# Patient Record
Sex: Female | Born: 1960 | Race: White | Hispanic: No | Marital: Single | Smoking: Never smoker
Health system: Southern US, Community
[De-identification: ages and names within clinical notes are randomized; demographics above are authoritative.]

## PROBLEM LIST (undated history)

## (undated) DIAGNOSIS — U071 COVID-19: Secondary | ICD-10-CM

## (undated) DIAGNOSIS — I10 Essential (primary) hypertension: Secondary | ICD-10-CM

## (undated) DIAGNOSIS — F329 Major depressive disorder, single episode, unspecified: Secondary | ICD-10-CM

## (undated) DIAGNOSIS — R011 Cardiac murmur, unspecified: Secondary | ICD-10-CM

## (undated) DIAGNOSIS — K759 Inflammatory liver disease, unspecified: Secondary | ICD-10-CM

## (undated) DIAGNOSIS — I499 Cardiac arrhythmia, unspecified: Secondary | ICD-10-CM

## (undated) DIAGNOSIS — A6009 Herpesviral infection of other urogenital tract: Secondary | ICD-10-CM

## (undated) DIAGNOSIS — T7840XA Allergy, unspecified, initial encounter: Secondary | ICD-10-CM

## (undated) DIAGNOSIS — R519 Headache, unspecified: Secondary | ICD-10-CM

## (undated) DIAGNOSIS — R51 Headache: Secondary | ICD-10-CM

## (undated) DIAGNOSIS — F32A Depression, unspecified: Secondary | ICD-10-CM

## (undated) HISTORY — DX: Major depressive disorder, single episode, unspecified: F32.9

## (undated) HISTORY — DX: Herpesviral infection of other urogenital tract: A60.09

## (undated) HISTORY — DX: COVID-19: U07.1

## (undated) HISTORY — DX: Headache, unspecified: R51.9

## (undated) HISTORY — DX: Depression, unspecified: F32.A

## (undated) HISTORY — DX: Cardiac murmur, unspecified: R01.1

## (undated) HISTORY — DX: Allergy, unspecified, initial encounter: T78.40XA

## (undated) HISTORY — PX: WRIST FRACTURE SURGERY: SHX121

## (undated) HISTORY — DX: Headache: R51

---

## 1991-06-20 HISTORY — PX: SEPTOPLASTY: SHX2393

## 2003-06-20 HISTORY — PX: BREAST BIOPSY: SHX20

## 2007-03-26 ENCOUNTER — Ambulatory Visit: Payer: Self-pay | Admitting: *Deleted

## 2007-04-03 ENCOUNTER — Ambulatory Visit: Payer: Self-pay | Admitting: Gastroenterology

## 2007-04-16 ENCOUNTER — Ambulatory Visit: Payer: Self-pay | Admitting: Nurse Practitioner

## 2007-08-01 ENCOUNTER — Ambulatory Visit: Payer: Self-pay | Admitting: Family Medicine

## 2008-02-19 ENCOUNTER — Ambulatory Visit: Payer: Self-pay | Admitting: Internal Medicine

## 2009-04-30 ENCOUNTER — Ambulatory Visit: Payer: Self-pay | Admitting: Internal Medicine

## 2009-07-07 LAB — HM COLONOSCOPY

## 2009-07-16 ENCOUNTER — Ambulatory Visit: Payer: Self-pay | Admitting: Internal Medicine

## 2010-03-25 ENCOUNTER — Ambulatory Visit: Payer: Self-pay | Admitting: Internal Medicine

## 2012-02-21 ENCOUNTER — Ambulatory Visit: Payer: Self-pay | Admitting: Family Medicine

## 2013-06-30 ENCOUNTER — Encounter (INDEPENDENT_AMBULATORY_CARE_PROVIDER_SITE_OTHER): Payer: Self-pay

## 2013-06-30 ENCOUNTER — Encounter: Payer: Self-pay | Admitting: Adult Health

## 2013-06-30 ENCOUNTER — Ambulatory Visit (INDEPENDENT_AMBULATORY_CARE_PROVIDER_SITE_OTHER): Payer: 59 | Admitting: Adult Health

## 2013-06-30 VITALS — BP 112/66 | HR 78 | Resp 12 | Ht 62.75 in | Wt 178.5 lb

## 2013-06-30 DIAGNOSIS — Z7689 Persons encountering health services in other specified circumstances: Secondary | ICD-10-CM

## 2013-06-30 DIAGNOSIS — Z1239 Encounter for other screening for malignant neoplasm of breast: Secondary | ICD-10-CM | POA: Insufficient documentation

## 2013-06-30 DIAGNOSIS — Z7189 Other specified counseling: Secondary | ICD-10-CM

## 2013-06-30 MED ORDER — ACYCLOVIR 5 % EX CREA: 1.0000 "application " | TOPICAL_CREAM | CUTANEOUS | Status: DC

## 2013-06-30 MED ORDER — VALACYCLOVIR HCL 1 G PO TABS
1000.0000 mg | ORAL_TABLET | Freq: Two times a day (BID) | ORAL | Status: DC
Start: 2013-06-30 — End: 2016-04-03

## 2013-06-30 MED ORDER — ACYCLOVIR 5 % EX CREA: 1.0000 "application " | TOPICAL_CREAM | Freq: Four times a day (QID) | CUTANEOUS | Status: DC

## 2013-06-30 NOTE — Assessment & Plan Note (Signed)
Reviewed H&P. Will request records from previous PCP. Send for Mammogram. Pt needs physical exam including breast, pelvic/PAP. She will schedule this.

## 2013-06-30 NOTE — Patient Instructions (Signed)
  Please remember to schedule your appt for a physical exam.  I have given you a prescription for your Mammogram. Please have them send me the results.  I will request your medical records from your previous provider.  I have sent in prescriptions for your medications as we discussed.  Thank you for choosing [place] [facility] for your health care needs.

## 2013-06-30 NOTE — Assessment & Plan Note (Signed)
Send for Mammogram 

## 2013-06-30 NOTE — Progress Notes (Signed)
Subjective:    Patient ID: Michele Hammond, female    DOB: 1960-07-24, 53 y.o.   MRN: [id number]  HPI  Patient is a pleasant 53 year old female who presents to clinic to establish care. She was previously followed by Dr. Marlan PalauAllison Hammond in [place], [place]. Will request records.   Mammogram - 2008 (Normal) - Will order PAP - Last 2011 - Normal - Needs to have this done     Past Medical History  Diagnosis Date  . Depression     Followed By Dr. Sallyanne Hammond [facility]  . Frequent headaches   . Allergy   . Heart murmur      Past Surgical History  Procedure Laterality Date  . Cesarean section  1990, 1996  . Septoplasty  1993    Deviated Septum     Family History  Problem Relation Age of Onset  . Cancer Mother     uterine cancer  . Depression Mother   . Hyperlipidemia Father   . Hypertension Father   . Diabetes Father   . Hypertension Sister   . Depression Sister   . Hypertension Sister   . Hypertension Brother      History   Social History  . Marital Status: Single    Spouse Name: N/A    Number of Children: 3  . Years of Education: 14   Occupational History  . Medical Lab Technician     [employer]   Social History Main Topics  . Smoking status: Never Smoker   . Smokeless tobacco: Never Used  . Alcohol Use: Yes     Comment: 1 glass of wine weekly  . Drug Use: No  . Sexual Activity: Not on file   Other Topics Concern  . Not on file   Social History Narrative   Michele Hammond grew up in [place], [place]. She is currently living in [place], [place]. She is living alone. 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 Michele Hammond enjoys riding her horses on her spare time. She has two horses and a pony.     Review of Systems  Constitutional: Positive for fatigue.  HENT: Positive for sinus pressure.   Eyes: Negative.   Respiratory: Negative.   Cardiovascular: Negative.   Gastrointestinal: Negative.   Endocrine: Negative.   Genitourinary: Negative.   Musculoskeletal: Negative.     Skin: Negative.   Allergic/Immunologic: Negative.   Neurological: Negative.   Hematological: Negative.   Psychiatric/Behavioral: Negative.        Objective:   Physical Exam  Constitutional: She is oriented to person, place, and time. She appears well-developed and well-nourished. No distress.  HENT:  Head: Normocephalic and atraumatic.  Nose: Nose normal.  Mouth/Throat: Oropharynx is clear and moist.  Eyes: Conjunctivae are normal. Pupils are equal, round, and reactive to light.  Neck: Normal range of motion. Neck supple. No tracheal deviation present. No thyromegaly present.  Cardiovascular: Normal rate, regular rhythm, normal heart sounds and intact distal pulses.  Exam reveals no gallop and no friction rub.   No murmur heard. Pulmonary/Chest: Effort normal and breath sounds normal. No respiratory distress. She has no wheezes. She has no rales.  Abdominal: Bowel sounds are normal.  Musculoskeletal: Normal range of motion. She exhibits no edema.  Lymphadenopathy:    She has no cervical adenopathy.  Neurological: She is alert and oriented to person, place, and time. She has normal reflexes. No cranial nerve deficit. Coordination normal.  Skin: Skin is warm and dry.  Psychiatric: She  has a normal mood and affect. Her behavior is normal. Judgment and thought content normal.          Assessment & Plan:

## 2013-07-14 ENCOUNTER — Encounter: Payer: Self-pay | Admitting: Adult Health

## 2013-07-17 ENCOUNTER — Ambulatory Visit: Payer: Self-pay | Admitting: Adult Health

## 2013-07-25 ENCOUNTER — Encounter: Payer: Self-pay | Admitting: Adult Health

## 2013-07-25 ENCOUNTER — Ambulatory Visit (INDEPENDENT_AMBULATORY_CARE_PROVIDER_SITE_OTHER): Payer: 59 | Admitting: Adult Health

## 2013-07-25 ENCOUNTER — Other Ambulatory Visit (HOSPITAL_COMMUNITY)
Admission: RE | Admit: 2013-07-25 | Discharge: 2013-07-25 | Disposition: A | Discharge status: Home / Self Care | Payer: 59 | Source: Ambulatory Visit | Attending: Adult Health | Admitting: Adult Health

## 2013-07-25 VITALS — BP 126/76 | HR 82 | Resp 12 | Ht 62.75 in | Wt 176.5 lb

## 2013-07-25 DIAGNOSIS — N86 Erosion and ectropion of cervix uteri: Secondary | ICD-10-CM

## 2013-07-25 DIAGNOSIS — Z01419 Encounter for gynecological examination (general) (routine) without abnormal findings: Secondary | ICD-10-CM | POA: Insufficient documentation

## 2013-07-25 DIAGNOSIS — K5900 Constipation, unspecified: Secondary | ICD-10-CM

## 2013-07-25 DIAGNOSIS — Z124 Encounter for screening for malignant neoplasm of cervix: Secondary | ICD-10-CM | POA: Insufficient documentation

## 2013-07-25 DIAGNOSIS — Z0000 Encounter for general adult medical examination without abnormal findings: Secondary | ICD-10-CM

## 2013-07-25 DIAGNOSIS — J3489 Other specified disorders of nose and nasal sinuses: Secondary | ICD-10-CM

## 2013-07-25 DIAGNOSIS — R233 Spontaneous ecchymoses: Secondary | ICD-10-CM

## 2013-07-25 DIAGNOSIS — Z1151 Encounter for screening for human papillomavirus (HPV): Secondary | ICD-10-CM | POA: Insufficient documentation

## 2013-07-25 MED ORDER — MOMETASONE FUROATE 50 MCG/ACT NA SUSP: NASAL | Status: DC

## 2013-07-25 MED ORDER — POLYETHYLENE GLYCOL 3350 17 G PO PACK: 17.0000 g | PACK | Freq: Every day | ORAL | Status: DC

## 2013-07-25 MED ORDER — CETIRIZINE HCL 10 MG PO TABS: 10.0000 mg | ORAL_TABLET | Freq: Every day | ORAL | Status: DC

## 2013-07-25 NOTE — Patient Instructions (Signed)
  I am referring you to GYN for a small ulceration on your cervix. I would like them to also evaluate the two moles and determine if these need to be biopsied.  I will contact you with the results of the PAP once it is available.  Please call with any questions or concerns.

## 2013-07-25 NOTE — Progress Notes (Signed)
Pre visit review using our clinic review tool, if applicable. No additional management support is needed unless otherwise documented below in the visit note. 

## 2013-07-25 NOTE — Progress Notes (Signed)
Patient ID: Michele Hammond, female   DOB: May 13, 1961, 53 y.o.   MRN: [id number]    Subjective:    Patient ID: Michele Hammond, female    DOB: 01-Jun-1961, 53 y.o.   MRN: [id number]  HPI  Patient presents to clinic for her yearly physical exam including breast, pelvic/pap. She also reports having sinus pressure that has been ongoing for some time. No fever or discoloration of secretions noted. Occasional HA. She also reports that she is experiencing some constipation. She believes it may be a side effect of one of her medications. No other concerns this visit.    Past Medical History  Diagnosis Date  . Depression     Followed By Dr. Sallyanne Kuster [facility]  . Frequent headaches   . Allergy   . Heart murmur   . Herpes genitalis in women    Past Surgical History  Procedure Laterality Date  . Cesarean section  1990, 1996  . Septoplasty  1993    Deviated Septum   Family History  Problem Relation Age of Onset  . Cancer Mother     uterine cancer  . Depression Mother   . Hyperlipidemia Father   . Hypertension Father   . Diabetes Father   . Hypertension Sister   . Depression Sister   . Hypertension Sister   . Hypertension Brother    History   Social History  . Marital Status: Single    Spouse Name: N/A    Number of Children: 3  . Years of Education: 14   Occupational History  . Medical Lab Technician     [employer]   Social History Main Topics  . Smoking status: Never Smoker   . Smokeless tobacco: Never Used  . Alcohol Use: Yes     Comment: 1 glass of wine weekly  . Drug Use: No  . Sexual Activity: Not on file   Other Topics Concern  . Not on file   Social History Narrative   Noya grew up in [place], [place]. She is currently living in [place], [place]. She is living alone. 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 Freddi enjoys riding her horses on her spare time. She has two horses and a pony.     Current Outpatient Prescriptions on File Prior to Visit  Medication Sig  Dispense Refill  . buPROPion (WELLBUTRIN XL) 150 MG 24 hr tablet Take 450 mg by mouth daily.      Marland Kitchen acyclovir cream (ZOVIRAX) 5 % Apply 1 application topically 4 (four) times daily.  15 g  0  . valACYclovir (VALTREX) 1000 MG tablet Take 1 tablet (1,000 mg total) by mouth 2 (two) times daily.  20 tablet  5   No current facility-administered medications on file prior to visit.     Review of Systems  Constitutional: Negative.   HENT: Positive for sinus pressure.   Eyes: Negative.   Respiratory: Negative.   Cardiovascular: Negative.   Gastrointestinal: Positive for constipation.  Endocrine: Negative.   Genitourinary: Negative.   Musculoskeletal: Negative.   Skin: Negative.   Allergic/Immunologic: Negative.   Neurological: Negative.   Hematological: Negative.   Psychiatric/Behavioral: Negative.        Objective:  BP 126/76  Pulse 82  Resp 12  Ht 5' 2.75" (1.594 m)  Wt 176 lb 8 oz (80.06 kg)  BMI 31.51 kg/m2  SpO2 97%   Physical Exam  Constitutional: She is oriented to person, place, and time. She appears well-developed and well-nourished.  No distress.  HENT:  Head: Normocephalic and atraumatic.  Right Ear: External ear normal.  Left Ear: External ear normal.  Nose: Nose normal.  Mouth/Throat: Oropharynx is clear and moist.  Eyes: Conjunctivae and EOM are normal. Pupils are equal, round, and reactive to light.  Neck: Normal range of motion. Neck supple. No tracheal deviation present. No thyromegaly present.  Cardiovascular: Normal rate, regular rhythm, normal heart sounds and intact distal pulses.  Exam reveals no gallop and no friction rub.   No murmur heard. Pulmonary/Chest: Effort normal and breath sounds normal. No respiratory distress. She has no wheezes. She has no rales.  Abdominal: Soft. Bowel sounds are normal. She exhibits no distension and no mass. There is no tenderness. There is no rebound and no guarding.  Genitourinary: Uterus normal. Rectal exam shows  external hemorrhoid. Rectal exam shows no internal hemorrhoid, no fissure, no mass, no tenderness and anal tone normal. Guaiac negative stool.    Right adnexum displays no mass, no tenderness and no fullness. Left adnexum displays no mass, no tenderness and no fullness.    Musculoskeletal: Normal range of motion. She exhibits no edema and no tenderness.  Lymphadenopathy:    She has no cervical adenopathy.  Neurological: She is alert and oriented to person, place, and time. She has normal reflexes. No cranial nerve deficit. Coordination normal.  Skin: Skin is warm and dry.  Psychiatric: She has a normal mood and affect. Her behavior is normal. Judgment and thought content normal.       Assessment & Plan:   1. Routine physical examination Normal physical exam except for problems listed separately below. PAP/Pelvic and breast exam completed. Routine preventative care addressed. Mammogram UTD. Medications reviewed and refills provided accordingly.  2. Pap smear for cervical cancer screening PAP with HPV done today. Cervical ulceration noted on exam  - Cytology - PAP   3. Ulcer, cervix Pt reports no hx of cervical issues. Refer to GYN for further evaluation.  4. Petechiae Noted on labia majora. Suspect this may be 2/2 her riding horses bareback. She also has a freckle and mole on labia. She reports that previous PCP wanted to biopsy but never done. Pt is being referred to GYN for further evaluation.   5. Sinus pressure Start Nasonex 2 sprays into each nostril daily.   6. Constipation Start Miralax daily. May adjust dose as needed depending on the consistency and frequency of stool. This was discussed with pt.

## 2013-07-26 ENCOUNTER — Encounter: Payer: Self-pay | Admitting: Adult Health

## 2013-07-26 DIAGNOSIS — K5900 Constipation, unspecified: Secondary | ICD-10-CM | POA: Insufficient documentation

## 2013-07-26 DIAGNOSIS — R233 Spontaneous ecchymoses: Secondary | ICD-10-CM | POA: Insufficient documentation

## 2013-07-26 DIAGNOSIS — J3489 Other specified disorders of nose and nasal sinuses: Secondary | ICD-10-CM | POA: Insufficient documentation

## 2013-07-31 ENCOUNTER — Encounter: Payer: Self-pay | Admitting: *Deleted

## 2013-08-12 ENCOUNTER — Encounter: Payer: Self-pay | Admitting: Adult Health

## 2013-11-07 ENCOUNTER — Encounter (INDEPENDENT_AMBULATORY_CARE_PROVIDER_SITE_OTHER): Payer: Self-pay

## 2013-11-07 ENCOUNTER — Encounter: Payer: Self-pay | Admitting: Adult Health

## 2013-11-07 ENCOUNTER — Ambulatory Visit (INDEPENDENT_AMBULATORY_CARE_PROVIDER_SITE_OTHER): Payer: 59 | Admitting: Adult Health

## 2013-11-07 VITALS — BP 122/78 | HR 88 | Temp 98.3°F | Resp 14 | Wt 173.5 lb

## 2013-11-07 DIAGNOSIS — R894 Abnormal immunological findings in specimens from other organs, systems and tissues: Secondary | ICD-10-CM

## 2013-11-07 DIAGNOSIS — B1920 Unspecified viral hepatitis C without hepatic coma: Secondary | ICD-10-CM | POA: Insufficient documentation

## 2013-11-07 DIAGNOSIS — R768 Other specified abnormal immunological findings in serum: Secondary | ICD-10-CM

## 2013-11-07 NOTE — Progress Notes (Signed)
Pre visit review using our clinic review tool, if applicable. No additional management support is needed unless otherwise documented below in the visit note. 

## 2013-11-07 NOTE — Progress Notes (Signed)
Patient ID: Michele Hammond, female   DOB: 06-05-1961, 53 y.o.   MRN: [id number]   Subjective:    Patient ID: Michele Hammond, female    DOB: 30-Aug-1960, 53 y.o.   MRN: [id number]  HPI  Patient is a pleasant 53 year old female who presents to clinic after testing positive for hepatitis C. Patient had antibody testing through employer. Her liver enzymes are normal. She has been experiencing fatigue. Patient reports that her first husband was an IV drug user. Believes she may have been exposed while married to him. She is very concerned and nervous about her test results. She would like to be referred as soon as possible to have this evaluated further.   Past Medical History  Diagnosis Date  . Depression     Followed By Dr. Sallyanne Kuster [facility]  . Frequent headaches   . Allergy   . Heart murmur   . Herpes genitalis in women     Current Outpatient Prescriptions on File Prior to Visit  Medication Sig Dispense Refill  . acyclovir cream (ZOVIRAX) 5 % Apply 1 application topically 4 (four) times daily.  15 g  0  . buPROPion (WELLBUTRIN XL) 150 MG 24 hr tablet Take 450 mg by mouth daily.      . cetirizine (ZYRTEC) 10 MG tablet Take 1 tablet (10 mg total) by mouth daily.  30 tablet  11  . mometasone (NASONEX) 50 MCG/ACT nasal spray 2 sprays into each nostril daily  17 g  11  . polyethylene glycol (MIRALAX / GLYCOLAX) packet Take 17 g by mouth daily.  14 each  11  . valACYclovir (VALTREX) 1000 MG tablet Take 1 tablet (1,000 mg total) by mouth 2 (two) times daily.  20 tablet  5  . venlafaxine (EFFEXOR) 75 MG tablet Take 150 mg by mouth daily.       No current facility-administered medications on file prior to visit.     Review of Systems  Constitutional: Positive for fatigue. Negative for fever (has felt feverish).  Respiratory: Negative.   Cardiovascular: Negative.   Gastrointestinal: Positive for nausea (occasional nausea). Negative for vomiting, abdominal pain and diarrhea.  Genitourinary:  Negative.   Musculoskeletal:       Malaise  Neurological: Positive for weakness. Negative for dizziness, light-headedness and headaches.  Psychiatric/Behavioral: The patient is nervous/anxious (about recent testing positive for hepatitis c).        Objective:  BP 122/78  Pulse 88  Temp(Src) 98.3 F (36.8 C) (Oral)  Resp 14  Wt 173 lb 8 oz (78.699 kg)  SpO2 98%   Physical Exam  Constitutional: She is oriented to person, place, and time. No distress.  HENT:  Head: Normocephalic and atraumatic.  Eyes: Conjunctivae and EOM are normal.  Neck: Normal range of motion. Neck supple.  Cardiovascular: Normal rate and regular rhythm.   Pulmonary/Chest: Effort normal. No respiratory distress.  Abdominal: Soft. Bowel sounds are normal. She exhibits no distension and no mass. There is no hepatosplenomegaly. There is no tenderness. There is no rebound and no guarding.  Musculoskeletal: Normal range of motion.  Neurological: She is alert and oriented to person, place, and time. She has normal reflexes. Coordination normal.  Skin: Skin is warm and dry.  Psychiatric: She has a normal mood and affect. Her behavior is normal. Judgment and thought content normal.      Assessment & Plan:   1. Hepatitis C antibody test positive Refer to Dr. Orvan Falconer in [place]. I am sending her to an  ultrasound. Recent blood work shows normal liver function. She is very nervous and anxious about getting evaluated. She has been feeling fatigue for some time and now concerned that she may have had this for many years. Concerned about complications. Spent time with her trying to allay some of her anxiety. Encouraged her to rest but stay as active as she tolerates. Avoid all alcohol and any medication that may be harsh on the liver such as tylenol.  - Ambulatory referral to Infectious Disease - US Abdomen Limited RUQ; Future

## 2013-11-07 NOTE — Patient Instructions (Addendum)
  I am referring you to have an ultrasound of the liver.  I am referring you to Dr. Orvan Falconer at [facility]. If you have not heard back from [place] on Wednesday please call.  Try to relax and not stress too much until we get more answers for you.  Avoid all alcohol. Avoid tylenol

## 2013-11-20 ENCOUNTER — Ambulatory Visit (INDEPENDENT_AMBULATORY_CARE_PROVIDER_SITE_OTHER): Payer: 59 | Admitting: Internal Medicine

## 2013-11-20 ENCOUNTER — Encounter: Payer: Self-pay | Admitting: Internal Medicine

## 2013-11-20 ENCOUNTER — Ambulatory Visit: Payer: Self-pay | Admitting: Adult Health

## 2013-11-20 VITALS — BP 133/89 | HR 82 | Temp 98.2°F | Ht 63.0 in | Wt 174.0 lb

## 2013-11-20 DIAGNOSIS — B1920 Unspecified viral hepatitis C without hepatic coma: Secondary | ICD-10-CM

## 2013-11-20 LAB — IRON: Iron: 48 ug/dL (ref 42–145)

## 2013-11-20 MED ORDER — DOXYCYCLINE HYCLATE 100 MG PO TABS: 100.0000 mg | ORAL_TABLET | Freq: Two times a day (BID) | ORAL | Status: DC

## 2013-11-20 NOTE — Progress Notes (Signed)
+  Michele Hammond is a 53 y.o. female who presents for initial evaluation and management of a positive Hepatitis C antibody test.  Patient tested positive after blood splash expsure through occupational health. Test was performed as part of an evaluation of exposure. Hepatitis C risk factors present are: sexual contact with person with liver disease (details: ex husband with hep c and died of liver cancer). Patient denies acupuncture, history of blood transfusion, history of clotting factor transfusion, intranasal drug use, IV drug abuse, multiple sexual partners, renal dialysis, tattoos. Patient has had other studies performed. Results: hepatitis C RNA by PCR, result: positive. Patient has not had prior treatment for Hepatitis C. Patient does not have a past history of liver disease. Patient does not have a family history of liver disease.   HPI:   Patient does not have documented immunity to Hepatitis A. Patient does have documented immunity to Hepatitis B.     Review of Systems A comprehensive review of systems was negative.   Past Medical History  Diagnosis Date  . Depression     Followed By Dr. Sallyanne Kuster [facility]  . Frequent headaches   . Allergy   . Heart murmur   . Herpes genitalis in women     History  Substance Use Topics  . Smoking status: Never Smoker   . Smokeless tobacco: Never Used  . Alcohol Use: Yes     Comment: 1 glass of wine weekly    Family History  Problem Relation Age of Onset  . Cancer Mother     uterine cancer  . Depression Mother   . Hyperlipidemia Father   . Hypertension Father   . Diabetes Father   . Hypertension Sister   . Depression Sister   . Hypertension Sister   . Hypertension Brother       Objective:   Filed Vitals:   11/20/13 1446  BP: 133/89  Pulse: 82  Temp: 98.2 F (36.8 C)   in no apparent distress and well developed and well nourished HEENT: anicteric Cor RRR and No murmurs clear Bowel sounds are normal, liver is not enlarged,  spleen is not enlarged peripheral pulses normal, no pedal edema, no clubbing or cyanosis negative for - jaundice, spider hemangioma, telangiectasia, palmar erythema, ecchymosis and atrophy  Laboratory Genotype: unknown No results found for this basename: hcvgenotype   HCV viral load:  No results found for this basename: HCVQUANT   No results found for this basename: WBC, HGB, HCT, MCV, PLT    No results found for this basename: CREATININE, BUN, NA, K, CL, CO2    No results found for this basename: ALT, AST, GGT, ALKPHOS, BILITOT, INR      Assessment: Hepatitis C genotype unknown  Plan: 1) Patient counseled extensively on limiting acetaminophen to no more than 2 grams daily, avoidance of alcohol. 2) Transmission discussed with patient including sexual transmission, sharing razors and toothbrush.   3) Will need referral to gastroenterology: depends on elastography 4) Will need referral for substance abuse counseling: No 5) Follow up 2 weeks after elastography

## 2013-11-21 LAB — PROTIME-INR
INR: 0.91 (ref <1.50)
Prothrombin Time: 12.2 seconds (ref 11.6–15.2)

## 2013-11-21 LAB — HCV RNA QUANT RFLX ULTRA OR GENOTYP
HCV Quantitative Log: 5.59 {Log} — ABNORMAL HIGH (ref <1.18)
HCV Quantitative: 389640 IU/mL — ABNORMAL HIGH (ref <15)

## 2013-11-21 LAB — ANA: Anti Nuclear Antibody(ANA): NEGATIVE

## 2013-11-24 ENCOUNTER — Other Ambulatory Visit: Payer: 59

## 2013-11-24 ENCOUNTER — Telehealth: Payer: Self-pay | Admitting: Adult Health

## 2013-11-24 ENCOUNTER — Telehealth: Payer: Self-pay | Admitting: *Deleted

## 2013-11-24 DIAGNOSIS — B1920 Unspecified viral hepatitis C without hepatic coma: Secondary | ICD-10-CM

## 2013-11-24 NOTE — Telephone Encounter (Signed)
LM on patient's voice mail.  Patient is scheduled for Elastography 6/15, 9:45 at [facility].  Scheduling's phone number is [phone] if she needs to alter the appt.  Pt must be NPO for 8 hours prior to the procedure.   Andree Coss, RN

## 2013-11-24 NOTE — Telephone Encounter (Signed)
Liver ultrasound results

## 2013-11-25 LAB — HEPATITIS C GENOTYPE: HCV GENOTYPE: 3

## 2013-11-26 ENCOUNTER — Telehealth: Payer: Self-pay | Admitting: Adult Health

## 2013-11-26 ENCOUNTER — Telehealth: Payer: Self-pay | Admitting: *Deleted

## 2013-11-26 NOTE — Telephone Encounter (Signed)
Confirmed upcoming appointments 6/15 - elastography and 6/18 - follow up.  Pt understands preparation instructions for elastography. Andree Coss, RN

## 2013-11-26 NOTE — Telephone Encounter (Signed)
No answer, voicemail left on patient's cell phone. Left callback for patient. Asked to call me back and let me know if she a GI preference.

## 2013-11-26 NOTE — Telephone Encounter (Signed)
Pt has not read the message I sent. Please call pt. 

## 2013-12-01 ENCOUNTER — Ambulatory Visit: Payer: Self-pay

## 2013-12-04 ENCOUNTER — Ambulatory Visit (INDEPENDENT_AMBULATORY_CARE_PROVIDER_SITE_OTHER): Payer: 59 | Admitting: Internal Medicine

## 2013-12-04 ENCOUNTER — Encounter: Payer: Self-pay | Admitting: Internal Medicine

## 2013-12-04 VITALS — BP 127/83 | HR 93 | Temp 98.0°F | Ht 63.0 in | Wt 174.0 lb

## 2013-12-04 DIAGNOSIS — B182 Chronic viral hepatitis C: Secondary | ICD-10-CM

## 2013-12-05 NOTE — Progress Notes (Addendum)
Here for follow up of HCV.  Had elastography which shows F0.  Has genotype 3.  Does not qualify for treatment at this time with no significant fibrosis, will discuss with pharmacy to see if possible, but not likely.  Also with genotype 3 better treatment options will be available in the future that don't require ribavirin.     Discussed results of elastography with her.  Hope to have better options in the future that are accesible, even if denied by insurance.   She will follow up in 1 year unless we are able to get treatment sooner, will keep her on Hep C list for follow up.    15 minutes spent with 10 minutes of discussion of results.

## 2013-12-09 ENCOUNTER — Encounter: Payer: Self-pay | Admitting: Adult Health

## 2013-12-12 ENCOUNTER — Encounter: Payer: Self-pay | Admitting: Adult Health

## 2014-01-12 ENCOUNTER — Encounter: Payer: Self-pay | Admitting: Internal Medicine

## 2014-03-02 ENCOUNTER — Other Ambulatory Visit: Payer: Self-pay | Admitting: Internal Medicine

## 2014-03-02 DIAGNOSIS — B182 Chronic viral hepatitis C: Secondary | ICD-10-CM

## 2014-03-02 MED ORDER — SOFOSBUVIR 400 MG PO TABS: 1.0000 | ORAL_TABLET | Freq: Every day | ORAL | Status: DC

## 2014-03-02 MED ORDER — DACLATASVIR DIHYDROCHLORIDE 60 MG PO TABS
60.0000 mg | ORAL_TABLET | Freq: Every day | ORAL | Status: DC
Start: 2014-03-02 — End: 2014-09-02

## 2014-03-13 ENCOUNTER — Telehealth: Payer: Self-pay | Admitting: Internal Medicine

## 2014-03-13 DIAGNOSIS — Z0279 Encounter for issue of other medical certificate: Secondary | ICD-10-CM

## 2014-03-13 NOTE — Telephone Encounter (Signed)
patient's FM [place] form has been completed for the diagnosis of hep C,  She does not need time off from work  For illness currently,  Only for MD visits with ID specialist.  She will be reassigned to the new NP now that Raquel has left.

## 2014-03-16 NOTE — Telephone Encounter (Signed)
FMLA faxed. 1 copy sent to scan, 1 given to manager for charge. Pt notified via mychart.

## 2014-05-29 ENCOUNTER — Other Ambulatory Visit: Payer: Self-pay | Admitting: Internal Medicine

## 2014-05-29 DIAGNOSIS — B182 Chronic viral hepatitis C: Secondary | ICD-10-CM

## 2014-05-29 NOTE — Progress Notes (Signed)
Patient notified and appointments scheduled. Gunda Maqueda  

## 2014-06-25 ENCOUNTER — Other Ambulatory Visit: Payer: 59

## 2014-06-25 DIAGNOSIS — B182 Chronic viral hepatitis C: Secondary | ICD-10-CM

## 2014-06-25 LAB — COMPLETE METABOLIC PANEL WITH GFR
ALBUMIN: 4.2 g/dL (ref 3.5–5.2)
ALT: 14 U/L (ref 0–35)
AST: 15 U/L (ref 0–37)
Alkaline Phosphatase: 55 U/L (ref 39–117)
BILIRUBIN TOTAL: 0.3 mg/dL (ref 0.2–1.2)
BUN: 10 mg/dL (ref 6–23)
CHLORIDE: 104 meq/L (ref 96–112)
CO2: 31 mEq/L (ref 19–32)
CREATININE: 0.55 mg/dL (ref 0.50–1.10)
Calcium: 9.8 mg/dL (ref 8.4–10.5)
GFR, Est African American: >89 mL/min
Glucose, Bld: 123 mg/dL — ABNORMAL HIGH (ref 70–99)
Potassium: 4.4 mEq/L (ref 3.5–5.3)
Sodium: 141 mEq/L (ref 135–145)
Total Protein: 6.6 g/dL (ref 6.0–8.3)

## 2014-06-25 LAB — CBC WITH DIFFERENTIAL/PLATELET
BASOS ABS: 0 10*3/uL (ref 0.0–0.1)
Basophils Relative: 0 % (ref 0–1)
EOS PCT: 4 % (ref 0–5)
Eosinophils Absolute: 0.2 10*3/uL (ref 0.0–0.7)
HEMATOCRIT: 37 % (ref 36.0–46.0)
HEMOGLOBIN: 12.8 g/dL (ref 12.0–15.0)
LYMPHS PCT: 49 % — AB (ref 12–46)
Lymphs Abs: 3 10*3/uL (ref 0.7–4.0)
MCH: 28.8 pg (ref 26.0–34.0)
MCHC: 34.6 g/dL (ref 30.0–36.0)
MCV: 83.1 fL (ref 78.0–100.0)
MONO ABS: 0.3 10*3/uL (ref 0.1–1.0)
MONOS PCT: 5 % (ref 3–12)
MPV: 8.9 fL (ref 8.6–12.4)
NEUTROS ABS: 2.6 10*3/uL (ref 1.7–7.7)
Neutrophils Relative %: 42 % — ABNORMAL LOW (ref 43–77)
PLATELETS: 237 10*3/uL (ref 150–400)
RBC: 4.45 MIL/uL (ref 3.87–5.11)
RDW: 12.6 % (ref 11.5–15.5)
WBC: 6.1 10*3/uL (ref 4.0–10.5)

## 2014-06-27 LAB — HEPATITIS C RNA QUANTITATIVE: HCV Quantitative: NOT DETECTED IU/mL (ref <15)

## 2014-06-30 ENCOUNTER — Other Ambulatory Visit: Payer: 59

## 2014-07-08 ENCOUNTER — Ambulatory Visit (INDEPENDENT_AMBULATORY_CARE_PROVIDER_SITE_OTHER): Payer: 59 | Admitting: Internal Medicine

## 2014-07-08 ENCOUNTER — Encounter: Payer: Self-pay | Admitting: Internal Medicine

## 2014-07-08 VITALS — BP 120/80 | HR 102 | Temp 98.6°F | Wt 174.0 lb

## 2014-07-08 DIAGNOSIS — B182 Chronic viral hepatitis C: Secondary | ICD-10-CM

## 2014-07-08 NOTE — Assessment & Plan Note (Signed)
She is doing well with the medication and feels better, less fatigue.  She is taking daily and pleased with regimen. She will return for labs in 7 weeks post treatment and I will see her in 8 weeks. She knows that she will then get the lab again in 3 months later to assure cure.

## 2014-07-08 NOTE — Progress Notes (Signed)
   Subjective:    Patient ID: Michele Hammond, female    DOB: Dec 08, 1960, 54 y.o.   MRN: [id number]  HPI She is here for follow-up of hepatitis C. I last saw her about 7 months ago for hepatitis C. She has genotype 3 with an initial viral load of 300,000 and elastography with F0. He also had complaints of fatigue for many years.  We initially were unable to get the medication however when newer medication came out we've reapplied and she was approved for daclatasvir and sofosbuvir for 12 weeks. She has now been on it for about one month. Her initial treatment viral load is now undetectable.   Review of Systems  Constitutional: Negative for fatigue.  Gastrointestinal: Negative for nausea and diarrhea.  Skin: Negative for rash.  Neurological: Negative for dizziness, light-headedness and headaches.       Objective:   Physical Exam  Constitutional: She appears well-developed and well-nourished. No distress.  Eyes: No scleral icterus.  Cardiovascular: Normal rate, regular rhythm and normal heart sounds.   No murmur heard. Pulmonary/Chest: Effort normal and breath sounds normal. No respiratory distress. She has no wheezes.  Skin: No rash noted.          Assessment & Plan:

## 2014-08-25 ENCOUNTER — Other Ambulatory Visit: Payer: 59

## 2014-08-27 ENCOUNTER — Other Ambulatory Visit: Payer: 59

## 2014-08-27 DIAGNOSIS — B182 Chronic viral hepatitis C: Secondary | ICD-10-CM

## 2014-09-01 LAB — HEPATITIS C RNA QUANTITATIVE: HCV Quantitative: NOT DETECTED IU/mL (ref <15)

## 2014-09-02 ENCOUNTER — Ambulatory Visit (INDEPENDENT_AMBULATORY_CARE_PROVIDER_SITE_OTHER): Payer: 59 | Admitting: Internal Medicine

## 2014-09-02 ENCOUNTER — Encounter: Payer: Self-pay | Admitting: Internal Medicine

## 2014-09-02 VITALS — BP 118/78 | HR 96 | Temp 98.0°F | Ht 63.0 in | Wt 172.0 lb

## 2014-09-02 DIAGNOSIS — B182 Chronic viral hepatitis C: Secondary | ICD-10-CM

## 2014-09-02 NOTE — Progress Notes (Signed)
   Subjective:    Patient ID: Michele Hammond, female    DOB: Jan 22, 1961, 54 y.o.   MRN: [id number]  HPI She is here for follow-up of hepatitis C. I last saw her about 7 months ago for hepatitis C. She has genotype 3 with an initial viral load of 300,000 and elastography with F0. He also had complaints of fatigue for many years.  We initially were unable to get the medication however when newer medication came out we've reapplied and she was approved for daclatasvir and sofosbuvir for 12 weeks. She has now completed treatment. Her post treatment viral load is undetectable.     Review of Systems  Constitutional: Negative for fatigue.  Gastrointestinal: Negative for nausea and diarrhea.  Skin: Negative for rash.  Neurological: Negative for dizziness, light-headedness and headaches.       Objective:   Physical Exam  Constitutional: She appears well-developed and well-nourished. No distress.  Eyes: No scleral icterus.  Cardiovascular: Normal rate, regular rhythm and normal heart sounds.   No murmur heard. Pulmonary/Chest: Effort normal and breath sounds normal. No respiratory distress. She has no wheezes.  Skin: No rash noted.          Assessment & Plan:

## 2014-09-02 NOTE — Assessment & Plan Note (Signed)
Doing great.  RTC 4 months to assure clearance.

## 2014-12-15 ENCOUNTER — Other Ambulatory Visit: Payer: 59

## 2014-12-15 DIAGNOSIS — B182 Chronic viral hepatitis C: Secondary | ICD-10-CM

## 2014-12-16 LAB — HEPATITIS C RNA QUANTITATIVE: HCV Quantitative: NOT DETECTED IU/mL (ref <15)

## 2014-12-29 ENCOUNTER — Telehealth: Payer: Self-pay | Admitting: *Deleted

## 2014-12-29 ENCOUNTER — Ambulatory Visit: Payer: 59 | Admitting: Internal Medicine

## 2014-12-29 NOTE — Telephone Encounter (Signed)
Left patient a voice mail that per Dr. Comer, she dLuciana Axeoes not need to reschedule her appt. Her Hep C viral load was negative. Wendall MolaJacqueline Avory Rahimi

## 2015-01-20 ENCOUNTER — Encounter: Payer: Self-pay | Admitting: Nurse Practitioner

## 2015-01-20 ENCOUNTER — Ambulatory Visit (INDEPENDENT_AMBULATORY_CARE_PROVIDER_SITE_OTHER): Payer: 59 | Admitting: Nurse Practitioner

## 2015-01-20 VITALS — BP 124/70 | HR 79 | Temp 98.5°F | Resp 14 | Ht 63.0 in | Wt 182.2 lb

## 2015-01-20 DIAGNOSIS — R1013 Epigastric pain: Secondary | ICD-10-CM

## 2015-01-20 DIAGNOSIS — R1011 Right upper quadrant pain: Secondary | ICD-10-CM | POA: Diagnosis not present

## 2015-01-20 DIAGNOSIS — K30 Functional dyspepsia: Secondary | ICD-10-CM

## 2015-01-20 LAB — HEPATIC FUNCTION PANEL
ALT: 16 U/L (ref 0–35)
AST: 20 U/L (ref 0–37)
Albumin: 4.4 g/dL (ref 3.5–5.2)
Alkaline Phosphatase: 55 U/L (ref 39–117)
BILIRUBIN TOTAL: 0.4 mg/dL (ref 0.2–1.2)
Bilirubin, Direct: 0.1 mg/dL (ref 0.0–0.3)
Total Protein: 6.6 g/dL (ref 6.0–8.3)

## 2015-01-20 MED ORDER — ONDANSETRON HCL 4 MG PO TABS: 4.0000 mg | ORAL_TABLET | Freq: Three times a day (TID) | ORAL | Status: DC | PRN

## 2015-01-20 NOTE — Patient Instructions (Signed)
We will contact you to schedule your ultrasound.   Cholecystitis Cholecystitis is an inflammation of your gallbladder. It is usually caused by a buildup of gallstones or sludge (cholelithiasis) in your gallbladder. The gallbladder stores a fluid that helps digest fats (bile). Cholecystitis is serious and needs treatment right away.  CAUSES   Gallstones. Gallstones can block the tube that leads to your gallbladder, causing bile to build up. As bile builds up, the gallbladder becomes inflamed.  Bile duct problems, such as blockage from scarring or kinking.  Tumors. Tumors can stop bile from leaving your gallbladder correctly, causing bile to build up. As bile builds up, the gallbladder becomes inflamed. SYMPTOMS   Nausea.  Vomiting.  Abdominal pain, especially in the upper right area of your abdomen.  Abdominal tenderness or bloating.  Sweating.  Chills.  Fever.  Yellowing of the skin and the whites of the eyes (jaundice). DIAGNOSIS  Your caregiver may order blood tests to look for infection or gallbladder problems. Your caregiver may also order imaging tests, such as an ultrasound or computed tomography (CT) scan. Further tests may include a hepatobiliary iminodiacetic acid (HIDA) scan. This scan allows your caregiver to see your bile move from the liver to the gallbladder and to the small intestine. TREATMENT  A hospital stay is usually necessary to lessen the inflammation of your gallbladder. You may be required to not eat or drink (fast) for a certain amount of time. You may be given medicine to treat pain or an antibiotic medicine to treat an infection. Surgery may be needed to remove your gallbladder (cholecystectomy) once the inflammation has gone down. Surgery may be needed right away if you develop complications such as death of gallbladder tissue (gangrene) or a tear (perforation) of the gallbladder.  HOME CARE INSTRUCTIONS  Home care will depend on your treatment. In  general:  If you were given antibiotics, take them as directed. Finish them even if you start to feel better.  Only take over-the-counter or prescription medicines for pain, discomfort, or fever as directed by your caregiver.  Follow a low-fat diet until you see your caregiver again.  Keep all follow-up visits as directed by your caregiver. SEEK IMMEDIATE MEDICAL CARE IF:   Your pain is increasing and not controlled by medicines.  Your pain moves to another part of your abdomen or to your back.  You have a fever.  You have nausea and vomiting. MAKE SURE YOU:  Understand these instructions.  Will watch your condition.  Will get help right away if you are not doing well or get worse. Document Released: 06/05/2005 Document Revised: 08/28/2011 Document Reviewed: 04/21/2011 [facility] Patient Information 2015 [place], [place]. This information is not intended to replace advice given to you by your health care provider. Make sure you discuss any questions you have with your health care provider.

## 2015-01-20 NOTE — Progress Notes (Signed)
Subjective:    Patient ID: Michele Hammond, female    DOB: July 26, 1960, 54 y.o.   MRN: [id number]  HPI  Michele Hammond is a 54 yo female with a CC of nausea.  1) Pt reports vomiting, epigastric pain, pinching feeling RUQ, started about 3 weeks ago and worsening. Had an episode last year and had an [place] with findings only related to the liver (pt recently completed Harvoni treatment).   Friday- RUQ discomfort and nausea, vomited once, diarrhea- loose and watery, pale brown    Feels "feverish" occasionally  Eating bland foods recently and watching   Review of Systems  Constitutional: Positive for appetite change. Negative for fever, chills, diaphoresis and fatigue.       Decreased appetite  Respiratory: Negative for chest tightness, shortness of breath and wheezing.   Cardiovascular: Negative for chest pain, palpitations and leg swelling.  Gastrointestinal: Positive for nausea, vomiting, abdominal pain, diarrhea and abdominal distention.  Skin: Negative for rash.  Neurological: Negative for dizziness, weakness, numbness and headaches.  Psychiatric/Behavioral: The patient is not nervous/anxious.    Past Medical History  Diagnosis Date  . Depression     Followed By Dr. Sallyanne Kuster [facility]  . Frequent headaches   . Allergy   . Heart murmur   . Herpes genitalis in women     History   Social History  . Marital Status: Single    Spouse Name: N/A  . Number of Children: 3  . Years of Education: 14   Occupational History  . Medical Lab Technician     [place]   Social History Main Topics  . Smoking status: Never Smoker   . Smokeless tobacco: Never Used  . Alcohol Use: Yes     Comment: 1 glass of wine weekly  . Drug Use: No  . Sexual Activity: Not on file   Other Topics Concern  . Not on file   Social History Narrative   Michele Hammond grew up in [place], [place]. She is currently living in [place], [place]. She is living alone. 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 Rossana enjoys riding  her horses on her spare time. She has two horses and a pony.    Past Surgical History  Procedure Laterality Date  . Cesarean section  1990, 1996  . Septoplasty  1993    Deviated Septum    Family History  Problem Relation Age of Onset  . Cancer Mother     uterine cancer  . Depression Mother   . Hyperlipidemia Father   . Hypertension Father   . Diabetes Father   . Hypertension Sister   . Depression Sister   . Hypertension Sister   . Hypertension Brother     Allergies  Allergen Reactions  . Ciprofloxacin Other (See Comments)    Turned face red  . Amoxicillin Rash  . Sulfa Antibiotics Rash    Current Outpatient Prescriptions on File Prior to Visit  Medication Sig Dispense Refill  . acyclovir cream (ZOVIRAX) 5 % Apply 1 application topically 4 (four) times daily. 15 g 0  . buPROPion (WELLBUTRIN XL) 150 MG 24 hr tablet Take 450 mg by mouth daily.    . cetirizine (ZYRTEC) 10 MG tablet Take 1 tablet (10 mg total) by mouth daily. 30 tablet 11  . DULoxetine (CYMBALTA) 60 MG capsule Take 60 mg by mouth daily.    . mometasone (NASONEX) 50 MCG/ACT nasal spray 2 sprays into each nostril daily 17 g 11  .  zolpidem (AMBIEN) 10 MG tablet Take 10 mg by mouth at bedtime as needed for sleep.    . valACYclovir (VALTREX) 1000 MG tablet Take 1 tablet (1,000 mg total) by mouth 2 (two) times daily. (Patient not taking: Reported on 01/20/2015) 20 tablet 5   No current facility-administered medications on file prior to visit.      Objective:   Physical Exam  Constitutional: She is oriented to person, place, and time. She appears well-developed and well-nourished. No distress.  BP 124/70 mmHg  Pulse 79  Temp(Src) 98.5 F (36.9 C)  Resp 14  Ht 5\' 3"  (1.6 m)  Wt 182 lb 3.2 oz (82.645 kg)  BMI 32.28 kg/m2  SpO2 98%   HENT:  Head: Normocephalic and atraumatic.  Right Ear: External ear normal.  Left Ear: External ear normal.  Cardiovascular: Normal rate, regular rhythm and normal heart  sounds.   Pulmonary/Chest: Effort normal and breath sounds normal. No respiratory distress. She has no wheezes. She has no rales. She exhibits no tenderness.  Abdominal: Soft. Bowel sounds are normal. She exhibits no distension and no mass. There is tenderness. There is no rebound and no guarding.  Slight tenderness to palpation of RUQ and epigastric areas, no hepatomegaly or splenomegaly on exam  Neurological: She is alert and oriented to person, place, and time. No cranial nerve deficit. She exhibits normal muscle tone. Coordination normal.  Skin: Skin is warm and dry. No rash noted. She is not diaphoretic.  Psychiatric: She has a normal mood and affect. Her behavior is normal. Judgment and thought content normal.      Assessment & Plan:

## 2015-01-20 NOTE — Assessment & Plan Note (Addendum)
Will obtain HFPs and US abdomen RUQ for investigation of possible cholecystitis. Pt does not seem acute in office today. Worsening symptoms over the last three weeks with the worst being last Friday. Will follow up after results. Zofran given for prn nausea.

## 2015-01-20 NOTE — Progress Notes (Signed)
Pre visit review using our clinic review tool, if applicable. No additional management support is needed unless otherwise documented below in the visit note. 

## 2015-01-21 ENCOUNTER — Other Ambulatory Visit (INDEPENDENT_AMBULATORY_CARE_PROVIDER_SITE_OTHER): Payer: Self-pay | Admitting: Adult Health

## 2015-01-22 ENCOUNTER — Other Ambulatory Visit: Payer: Self-pay | Admitting: Nurse Practitioner

## 2015-01-22 ENCOUNTER — Ambulatory Visit
Admission: RE | Admit: 2015-01-22 | Discharge: 2015-01-22 | Disposition: A | Discharge status: Home / Self Care | Payer: 59 | Source: Ambulatory Visit | Attending: Nurse Practitioner | Admitting: Nurse Practitioner

## 2015-01-22 DIAGNOSIS — R1013 Epigastric pain: Secondary | ICD-10-CM | POA: Diagnosis present

## 2015-01-22 DIAGNOSIS — R1011 Right upper quadrant pain: Secondary | ICD-10-CM | POA: Diagnosis present

## 2015-01-22 DIAGNOSIS — K8020 Calculus of gallbladder without cholecystitis without obstruction: Secondary | ICD-10-CM | POA: Diagnosis not present

## 2015-01-22 DIAGNOSIS — K30 Functional dyspepsia: Secondary | ICD-10-CM

## 2015-01-22 MED ORDER — ACYCLOVIR 5 % EX CREA: 1.0000 "application " | TOPICAL_CREAM | Freq: Four times a day (QID) | CUTANEOUS | Status: DC

## 2015-01-26 ENCOUNTER — Ambulatory Visit: Payer: 59

## 2015-01-26 ENCOUNTER — Other Ambulatory Visit: Payer: Self-pay | Admitting: Nurse Practitioner

## 2015-01-26 DIAGNOSIS — K8020 Calculus of gallbladder without cholecystitis without obstruction: Secondary | ICD-10-CM

## 2015-01-27 ENCOUNTER — Ambulatory Visit: Payer: 59 | Admitting: Internal Medicine

## 2015-02-01 ENCOUNTER — Encounter: Payer: Self-pay | Admitting: Surgery

## 2015-02-01 ENCOUNTER — Ambulatory Visit (INDEPENDENT_AMBULATORY_CARE_PROVIDER_SITE_OTHER): Payer: 59 | Admitting: Surgery

## 2015-02-01 VITALS — BP 149/90 | HR 88 | Temp 98.1°F | Ht 63.0 in | Wt 182.0 lb

## 2015-02-01 DIAGNOSIS — K8020 Calculus of gallbladder without cholecystitis without obstruction: Secondary | ICD-10-CM | POA: Diagnosis not present

## 2015-02-01 MED ORDER — DEXTROSE 5 % IV SOLN: 600.0000 mg | Freq: Once | INTRAVENOUS | Status: DC

## 2015-02-01 NOTE — Patient Instructions (Signed)
Please give us a call if you have any questions or concerns. 

## 2015-02-01 NOTE — Progress Notes (Signed)
CC: RUQ pain HPI: Michele Hammond is a pleasant 54 yo F with a history of Hepatitis C, now cured who presents with worsening recurrent RUQ pain.  Has had for years but has become more frequent.  + nausea.  Worse with fatty food ingestion.  + gallstones on ultrasound.  Otherwise no fevers/chills, night sweats, shortness of breath, cough, chest pain, diarrhea/constipation, dysuria/hematuria.  Active Ambulatory Problems    Diagnosis Date Noted  . Screening for breast cancer 06/30/2013  . Encounter to establish care 06/30/2013  . Routine physical examination 07/25/2013  . Screening for cervical cancer 07/25/2013  . Ulcer, cervix 07/25/2013  . Sinus pressure 07/26/2013  . Petechiae 07/26/2013  . Constipation 07/26/2013  . Hepatitis C virus infection without hepatic coma 11/07/2013  . RUQ pain 01/20/2015   Resolved Ambulatory Problems    Diagnosis Date Noted  . No Resolved Ambulatory Problems   Past Medical History  Diagnosis Date  . Depression   . Frequent headaches   . Allergy   . Heart murmur   . Herpes genitalis in women    Past Surgical History  Procedure Laterality Date  . Cesarean section  1990, 1996  . Septoplasty  1993    Deviated Septum     Medication List       This list is accurate as of: 02/01/15  3:10 PM.  Always use your most recent med list.               acyclovir cream 5 %  Commonly known as:  ZOVIRAX  Apply 1 application topically 4 (four) times daily.     buPROPion 150 MG 24 hr tablet  Commonly known as:  WELLBUTRIN XL  Take 450 mg by mouth daily.     cetirizine 10 MG tablet  Commonly known as:  ZYRTEC  Take 1 tablet (10 mg total) by mouth daily.     DULoxetine 60 MG capsule  Commonly known as:  CYMBALTA  Take 60 mg by mouth daily.     mometasone 50 MCG/ACT nasal spray  Commonly known as:  NASONEX  2 sprays into each nostril daily     ondansetron 4 MG tablet  Commonly known as:  ZOFRAN  Take 1 tablet (4 mg total) by mouth every 8 (eight) hours  as needed for nausea or vomiting.     valACYclovir 1000 MG tablet  Commonly known as:  VALTREX  Take 1 tablet (1,000 mg total) by mouth 2 (two) times daily.     zolpidem 10 MG tablet  Commonly known as:  AMBIEN  Take 10 mg by mouth at bedtime as needed for sleep.       Allergies  Allergen Reactions  . Ciprofloxacin Other (See Comments)    Turned face red  . Amoxicillin Rash  . Sulfa Antibiotics Rash   Social History   Social History  . Marital Status: Single    Spouse Name: N/A  . Number of Children: 3  . Years of Education: 14   Occupational History  . Medical Lab Technician     [place]   Social History Main Topics  . Smoking status: Never Smoker   . Smokeless tobacco: Never Used  . Alcohol Use: Yes     Comment: 1 glass of wine weekly  . Drug Use: No  . Sexual Activity: Not on file   Other Topics Concern  . Not on file   Social History Narrative   Tyquisha grew up in [place], [place]. She is  currently living in [place], [place]. She is living alone. 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 Jenness enjoys riding her horses on her spare time. She has two horses and a pony.   Family History  Problem Relation Age of Onset  . Cancer Mother     uterine cancer  . Depression Mother   . Hyperlipidemia Father   . Hypertension Father   . Diabetes Father   . Hypertension Sister   . Depression Sister   . Hypertension Sister   . Hypertension Brother    ROS: Full ROS obtained, pertinent positives and negatives as above  Blood pressure 149/90, pulse 88, temperature 98.1 F (36.7 C), temperature source Oral, height 5\' 3"  (1.6 m), weight 182 lb (82.555 kg). GEN: NAD/A&Ox3 FACE: no obvious facial trauma, normal external nose, normal external ears EYES: no scleral icterus, no conjunctivitis HEAD: normocephalic atraumatic CV: RRR, no MRG RESP: moving air well, lungs clear ABD: soft, nontender, nondistended EXT: moving all ext well, strength 5/5 NEURO: cnII-XII grossly  intact, sensation intact all 4 ext  Labs: recent labs reviewed, significant for LFT - LFT unremarkable  Imaging: U/S shows large gallstone, normal CBD  A/P 54 yo F with persistent and recurrent RUQ pain.  + gallstones on U/S. Worse with eating.  I feel that this pain is biliary in nature and have offered cholecystectomy.  I have explained the procedure and benefits and risks including a 1:200 risk of CBD injury.  She would like to proceed.

## 2015-02-01 NOTE — H&P (Signed)
CC: RUQ pain HPI: Michele Hammond is a pleasant 54 yo F with a history of Hepatitis C, now cured who presents with worsening recurrent RUQ pain.  Has had for years but has become more frequent.  + nausea.  Worse with fatty food ingestion.  + gallstones on ultrasound.  Otherwise no fevers/chills, night sweats, shortness of breath, cough, chest pain, diarrhea/constipation, dysuria/hematuria.  Active Ambulatory Problems    Diagnosis Date Noted  . Screening for breast cancer 06/30/2013  . Encounter to establish care 06/30/2013  . Routine physical examination 07/25/2013  . Screening for cervical cancer 07/25/2013  . Ulcer, cervix 07/25/2013  . Sinus pressure 07/26/2013  . Petechiae 07/26/2013  . Constipation 07/26/2013  . Hepatitis C virus infection without hepatic coma 11/07/2013  . RUQ pain 01/20/2015   Resolved Ambulatory Problems    Diagnosis Date Noted  . No Resolved Ambulatory Problems   Past Medical History  Diagnosis Date  . Depression   . Frequent headaches   . Allergy   . Heart murmur   . Herpes genitalis in women    Past Surgical History  Procedure Laterality Date  . Cesarean section  1990, 1996  . Septoplasty  1993    Deviated Septum     Medication List       This list is accurate as of: 02/01/15  3:10 PM.  Always use your most recent med list.               acyclovir cream 5 %  Commonly known as:  ZOVIRAX  Apply 1 application topically 4 (four) times daily.     buPROPion 150 MG 24 hr tablet  Commonly known as:  WELLBUTRIN XL  Take 450 mg by mouth daily.     cetirizine 10 MG tablet  Commonly known as:  ZYRTEC  Take 1 tablet (10 mg total) by mouth daily.     DULoxetine 60 MG capsule  Commonly known as:  CYMBALTA  Take 60 mg by mouth daily.     mometasone 50 MCG/ACT nasal spray  Commonly known as:  NASONEX  2 sprays into each nostril daily     ondansetron 4 MG tablet  Commonly known as:  ZOFRAN  Take 1 tablet (4 mg total) by mouth every 8 (eight) hours  as needed for nausea or vomiting.     valACYclovir 1000 MG tablet  Commonly known as:  VALTREX  Take 1 tablet (1,000 mg total) by mouth 2 (two) times daily.     zolpidem 10 MG tablet  Commonly known as:  AMBIEN  Take 10 mg by mouth at bedtime as needed for sleep.       Allergies  Allergen Reactions  . Ciprofloxacin Other (See Comments)    Turned face red  . Amoxicillin Rash  . Sulfa Antibiotics Rash   Social History   Social History  . Marital Status: Single    Spouse Name: N/A  . Number of Children: 3  . Years of Education: 14   Occupational History  . Medical Lab Technician     [place]   Social History Main Topics  . Smoking status: Never Smoker   . Smokeless tobacco: Never Used  . Alcohol Use: Yes     Comment: 1 glass of wine weekly  . Drug Use: No  . Sexual Activity: Not on file   Other Topics Concern  . Not on file   Social History Narrative   Tyquisha grew up in [place], [place]. She is  currently living in [place], [place]. She is living alone. 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 She has two horses and a pony.   Family History  Problem Relation Age of Onset  . Cancer Mother     uterine cancer  . Depression Mother   . Hyperlipidemia Father   . Hypertension Father   . Diabetes Father   . Hypertension Sister   . Depression Sister   . Hypertension Sister   . Hypertension Brother    ROS: Full ROS obtained, pertinent positives and negatives as above  Blood pressure 149/90, pulse 88, temperature 98.1 F (36.7 C), temperature source Oral, height  (1.6 m), weight 182 lb (82.555 kg). GEN: NAD/A&Ox3 FACE: no obvious facial trauma, normal external nose, normal external ears EYES: no scleral icterus, no conjunctivitis HEAD: normocephalic atraumatic CV: RRR, no MRG RESP: moving air well, lungs clear ABD: soft, nontender, nondistended EXT: moving all ext well, strength 5/5 NEURO: cnII-XII grossly  intact, sensation intact all 4 ext  Labs: recent labs reviewed, significant for LFT - LFT unremarkable  Imaging: U/S shows large gallstone, normal CBD  A/P 54 yo F with persistent and recurrent RUQ pain.  + gallstones on U/S. Worse with eating.  I feel that this pain is biliary in nature and have offered cholecystectomy.  I have explained the procedure and benefits and risks including a 1:200 risk of CBD injury.  She would like to proceed.

## 2015-02-02 ENCOUNTER — Telehealth: Payer: Self-pay | Admitting: Surgery

## 2015-02-02 NOTE — Telephone Encounter (Signed)
Pt advised of pre op date/time and sx date. Sx: 02/16/15 with Dr Randa Lynn chole Pre op: 02/10/15 bw 1-5--telephone.

## 2015-02-10 ENCOUNTER — Encounter: Payer: Self-pay | Admitting: *Deleted

## 2015-02-10 ENCOUNTER — Other Ambulatory Visit: Payer: 59

## 2015-02-10 NOTE — Patient Instructions (Signed)
  Your procedure is scheduled on: 02-16-15 Report to [facility] 2ND FLOOR To find out your arrival time please call [phone] between 1PM - 3PM on 02-15-15  Remember: Instructions that are not followed completely may result in serious medical risk, up to and including death, or upon the discretion of your surgeon and anesthesiologist your surgery may need to be rescheduled.    _X___ 1. Do not eat food or drink liquids after midnight. No gum chewing or hard candies.     _X___ 2. No Alcohol for 24 hours before or after surgery.   ____ 3. Bring all medications with you on the day of surgery if instructed.    ____ 4. Notify your doctor if there is any change in your medical condition     (cold, fever, infections).     Do not wear jewelry, make-up, hairpins, clips or nail polish.  Do not wear lotions, powders, or perfumes. You may wear deodorant.  Do not shave 48 hours prior to surgery. Men may shave face and neck.  Do not bring valuables to the hospital.    [facility] is not responsible for any belongings or valuables.               Contacts, dentures or bridgework may not be worn into surgery.  Leave your suitcase in the car. After surgery it may be brought to your room.  For patients admitted to the hospital, discharge time is determined by your treatment team.   Patients discharged the day of surgery will not be allowed to drive home.   Please read over the following fact sheets that you were given:      _X___ Take these medicines the morning of surgery with A SIP OF WATER:    1. ZANTAC  2. WELLBUTRIN  3. CYMBALTA  4.  5.  6.  ____ Fleet Enema (as directed)   ____ Use CHG Soap as directed  ____ Use inhalers on the day of surgery  ____ Stop metformin 2 days prior to surgery    ____ Take 1/2 of usual insulin dose the night before surgery and none on the morning of surgery.   ____ Stop Coumadin/Plavix/aspirin-N/A  ____ Stop Anti-inflammatories-NO  NSAIDS OR ASA PRODUCTS-TYLENOL OK   ____ Stop supplements until after surgery.    ____ Bring C-Pap to the hospital.

## 2015-02-11 DIAGNOSIS — R233 Spontaneous ecchymoses: Secondary | ICD-10-CM | POA: Diagnosis not present

## 2015-02-11 DIAGNOSIS — Z833 Family history of diabetes mellitus: Secondary | ICD-10-CM | POA: Diagnosis not present

## 2015-02-11 DIAGNOSIS — Z881 Allergy status to other antibiotic agents status: Secondary | ICD-10-CM | POA: Diagnosis not present

## 2015-02-11 DIAGNOSIS — Z8249 Family history of ischemic heart disease and other diseases of the circulatory system: Secondary | ICD-10-CM | POA: Diagnosis not present

## 2015-02-11 DIAGNOSIS — K8020 Calculus of gallbladder without cholecystitis without obstruction: Secondary | ICD-10-CM | POA: Diagnosis not present

## 2015-02-11 DIAGNOSIS — Z8489 Family history of other specified conditions: Secondary | ICD-10-CM | POA: Diagnosis not present

## 2015-02-11 DIAGNOSIS — Z8049 Family history of malignant neoplasm of other genital organs: Secondary | ICD-10-CM | POA: Diagnosis not present

## 2015-02-11 DIAGNOSIS — Z882 Allergy status to sulfonamides status: Secondary | ICD-10-CM | POA: Diagnosis not present

## 2015-02-11 DIAGNOSIS — Z79899 Other long term (current) drug therapy: Secondary | ICD-10-CM | POA: Diagnosis not present

## 2015-02-11 DIAGNOSIS — B1920 Unspecified viral hepatitis C without hepatic coma: Secondary | ICD-10-CM | POA: Diagnosis not present

## 2015-02-11 DIAGNOSIS — K5900 Constipation, unspecified: Secondary | ICD-10-CM | POA: Diagnosis not present

## 2015-02-11 LAB — COMPREHENSIVE METABOLIC PANEL
ALT: 18 U/L (ref 14–54)
ANION GAP: 7 (ref 5–15)
AST: 20 U/L (ref 15–41)
Albumin: 4.6 g/dL (ref 3.5–5.0)
Alkaline Phosphatase: 54 U/L (ref 38–126)
BUN: 8 mg/dL (ref 6–20)
CHLORIDE: 106 mmol/L (ref 101–111)
CO2: 28 mmol/L (ref 22–32)
CREATININE: 0.63 mg/dL (ref 0.44–1.00)
Calcium: 9.3 mg/dL (ref 8.9–10.3)
Glucose, Bld: 89 mg/dL (ref 65–99)
POTASSIUM: 4 mmol/L (ref 3.5–5.1)
Sodium: 141 mmol/L (ref 135–145)
Total Bilirubin: 0.2 mg/dL — ABNORMAL LOW (ref 0.3–1.2)
Total Protein: 7.2 g/dL (ref 6.5–8.1)

## 2015-02-11 LAB — CBC WITH DIFFERENTIAL/PLATELET
Basophils Absolute: 0 10*3/uL (ref 0–0.1)
Basophils Relative: 1 %
EOS PCT: 3 %
Eosinophils Absolute: 0.2 10*3/uL (ref 0–0.7)
HCT: 37.6 % (ref 35.0–47.0)
Hemoglobin: 12.6 g/dL (ref 12.0–16.0)
LYMPHS ABS: 2.4 10*3/uL (ref 1.0–3.6)
LYMPHS PCT: 40 %
MCH: 29.8 pg (ref 26.0–34.0)
MCHC: 33.5 g/dL (ref 32.0–36.0)
MCV: 88.7 fL (ref 80.0–100.0)
MONO ABS: 0.3 10*3/uL (ref 0.2–0.9)
MONOS PCT: 5 %
Neutro Abs: 3.2 10*3/uL (ref 1.4–6.5)
Neutrophils Relative %: 51 %
PLATELETS: 220 10*3/uL (ref 150–440)
RBC: 4.24 MIL/uL (ref 3.80–5.20)
RDW: 13.2 % (ref 11.5–14.5)
WBC: 6.1 10*3/uL (ref 3.6–11.0)

## 2015-02-15 ENCOUNTER — Encounter: Payer: Self-pay | Admitting: *Deleted

## 2015-02-16 ENCOUNTER — Encounter: Payer: Self-pay | Admitting: *Deleted

## 2015-02-16 ENCOUNTER — Ambulatory Visit: Payer: 59 | Admitting: Anesthesiology

## 2015-02-16 ENCOUNTER — Encounter
Admission: RE | Disposition: A | Discharge status: Home / Self Care | Payer: Self-pay | Source: Ambulatory Visit | Attending: Surgery

## 2015-02-16 ENCOUNTER — Ambulatory Visit
Admission: RE | Admit: 2015-02-16 | Discharge: 2015-02-16 | Disposition: A | Discharge status: Home / Self Care | Payer: 59 | Source: Ambulatory Visit | Attending: Surgery | Admitting: Surgery

## 2015-02-16 DIAGNOSIS — Z8489 Family history of other specified conditions: Secondary | ICD-10-CM | POA: Insufficient documentation

## 2015-02-16 DIAGNOSIS — K8020 Calculus of gallbladder without cholecystitis without obstruction: Secondary | ICD-10-CM | POA: Diagnosis not present

## 2015-02-16 DIAGNOSIS — Z8249 Family history of ischemic heart disease and other diseases of the circulatory system: Secondary | ICD-10-CM | POA: Insufficient documentation

## 2015-02-16 DIAGNOSIS — K5900 Constipation, unspecified: Secondary | ICD-10-CM | POA: Insufficient documentation

## 2015-02-16 DIAGNOSIS — R233 Spontaneous ecchymoses: Secondary | ICD-10-CM | POA: Insufficient documentation

## 2015-02-16 DIAGNOSIS — Z882 Allergy status to sulfonamides status: Secondary | ICD-10-CM | POA: Insufficient documentation

## 2015-02-16 DIAGNOSIS — Z881 Allergy status to other antibiotic agents status: Secondary | ICD-10-CM | POA: Insufficient documentation

## 2015-02-16 DIAGNOSIS — B1920 Unspecified viral hepatitis C without hepatic coma: Secondary | ICD-10-CM | POA: Insufficient documentation

## 2015-02-16 DIAGNOSIS — Z79899 Other long term (current) drug therapy: Secondary | ICD-10-CM | POA: Insufficient documentation

## 2015-02-16 DIAGNOSIS — Z833 Family history of diabetes mellitus: Secondary | ICD-10-CM | POA: Insufficient documentation

## 2015-02-16 DIAGNOSIS — Z8049 Family history of malignant neoplasm of other genital organs: Secondary | ICD-10-CM | POA: Insufficient documentation

## 2015-02-16 HISTORY — DX: Inflammatory liver disease, unspecified: K75.9

## 2015-02-16 HISTORY — DX: Cardiac arrhythmia, unspecified: I49.9

## 2015-02-16 HISTORY — PX: CHOLECYSTECTOMY: SHX55

## 2015-02-16 SURGERY — LAPAROSCOPIC CHOLECYSTECTOMY
Anesthesia: General | Wound class: Clean Contaminated

## 2015-02-16 MED ORDER — FENTANYL CITRATE (PF) 100 MCG/2ML IJ SOLN: 25.0000 ug | INTRAMUSCULAR | Status: DC | PRN

## 2015-02-16 MED ORDER — MIDAZOLAM HCL 2 MG/2ML IJ SOLN
INTRAMUSCULAR | Status: DC | PRN
  Administered 2015-02-16: 2 mg via INTRAVENOUS

## 2015-02-16 MED ORDER — ROCURONIUM BROMIDE 100 MG/10ML IV SOLN
INTRAVENOUS | Status: DC | PRN
  Administered 2015-02-16: 38 mg via INTRAVENOUS

## 2015-02-16 MED ORDER — SODIUM CHLORIDE 0.9 % IR SOLN
Status: DC | PRN
  Administered 2015-02-16: 1000 mL

## 2015-02-16 MED ORDER — LACTATED RINGERS IV SOLN
INTRAVENOUS | Status: DC
  Administered 2015-02-16: 09:00:00 via INTRAVENOUS

## 2015-02-16 MED ORDER — LIDOCAINE HCL (PF) 1 % IJ SOLN
INTRAMUSCULAR | Status: AC
  Filled 2015-02-16: qty 30

## 2015-02-16 MED ORDER — ACETAMINOPHEN 10 MG/ML IV SOLN
INTRAVENOUS | Status: AC
  Filled 2015-02-16: qty 100

## 2015-02-16 MED ORDER — ONDANSETRON HCL 4 MG/2ML IJ SOLN
INTRAMUSCULAR | Status: DC
Start: 2015-02-16 — End: 2015-02-16
  Filled 2015-02-16: qty 2

## 2015-02-16 MED ORDER — GLYCOPYRROLATE 0.2 MG/ML IJ SOLN
INTRAMUSCULAR | Status: DC | PRN
  Administered 2015-02-16: .5 mg via INTRAVENOUS

## 2015-02-16 MED ORDER — ONDANSETRON HCL 4 MG/2ML IJ SOLN
INTRAMUSCULAR | Status: DC | PRN
  Administered 2015-02-16: 4 mg via INTRAVENOUS

## 2015-02-16 MED ORDER — BUPIVACAINE-EPINEPHRINE (PF) 0.25% -1:200000 IJ SOLN
INTRAMUSCULAR | Status: AC
  Filled 2015-02-16: qty 30

## 2015-02-16 MED ORDER — BUPIVACAINE-EPINEPHRINE (PF) 0.25% -1:200000 IJ SOLN
INTRAMUSCULAR | Status: DC | PRN
Start: 2015-02-16 — End: 2015-02-16
  Administered 2015-02-16: 13 mL

## 2015-02-16 MED ORDER — OXYCODONE-ACETAMINOPHEN 5-325 MG PO TABS: 1.0000 | ORAL_TABLET | Freq: Four times a day (QID) | ORAL | Status: DC | PRN

## 2015-02-16 MED ORDER — LIDOCAINE HCL (CARDIAC) 20 MG/ML IV SOLN
INTRAVENOUS | Status: DC | PRN
  Administered 2015-02-16: 100 mg via INTRAVENOUS

## 2015-02-16 MED ORDER — PROPOFOL 10 MG/ML IV BOLUS
INTRAVENOUS | Status: DC | PRN
  Administered 2015-02-16: 180 mg via INTRAVENOUS

## 2015-02-16 MED ORDER — ONDANSETRON HCL 4 MG/2ML IJ SOLN
4.0000 mg | Freq: Once | INTRAMUSCULAR | Status: AC | PRN
  Administered 2015-02-16: 4 mg via INTRAVENOUS

## 2015-02-16 MED ORDER — FENTANYL CITRATE (PF) 100 MCG/2ML IJ SOLN
INTRAMUSCULAR | Status: DC | PRN
  Administered 2015-02-16: 50 ug via INTRAVENOUS
  Administered 2015-02-16: 150 ug via INTRAVENOUS
  Administered 2015-02-16: 50 ug via INTRAVENOUS
  Administered 2015-02-16: 2 ug via INTRAVENOUS

## 2015-02-16 MED ORDER — ACETAMINOPHEN 10 MG/ML IV SOLN
INTRAVENOUS | Status: DC | PRN
  Administered 2015-02-16: 1000 mg via INTRAVENOUS

## 2015-02-16 MED ORDER — NEOSTIGMINE METHYLSULFATE 10 MG/10ML IV SOLN
INTRAVENOUS | Status: DC | PRN
  Administered 2015-02-16: 3 mg via INTRAVENOUS

## 2015-02-16 MED ORDER — DEXAMETHASONE SODIUM PHOSPHATE 4 MG/ML IJ SOLN
INTRAMUSCULAR | Status: DC | PRN
  Administered 2015-02-16: 5 mg via INTRAVENOUS

## 2015-02-16 SURGICAL SUPPLY — 47 items
APPLIER CLIP ROT 10 11.4 M/L (STAPLE) ×2
BAG COUNTER SPONGE EZ (MISCELLANEOUS) IMPLANT
BLADE SURG SZ11 CARB STEEL (BLADE) ×2 IMPLANT
BULB RESERV EVAC DRAIN JP 100C (MISCELLANEOUS) ×2 IMPLANT
CANISTER SUCT 1200ML W/VALVE (MISCELLANEOUS) ×2 IMPLANT
CATH REDDICK CHOLANGI 4FR 50CM (CATHETERS) IMPLANT
CHLORAPREP W/TINT 26ML (MISCELLANEOUS) ×2 IMPLANT
CLIP APPLIE ROT 10 11.4 M/L (STAPLE) ×1 IMPLANT
CONRAY 60ML FOR OR (MISCELLANEOUS) IMPLANT
DISSECTOR KITTNER STICK (MISCELLANEOUS) IMPLANT
DISSECTORS/KITTNER STICK (MISCELLANEOUS)
DRAIN CHANNEL JP 19F (MISCELLANEOUS) IMPLANT
DRAPE SHEET LG 3/4 BI-LAMINATE (DRAPES) ×2 IMPLANT
DRSG TEGADERM 2-3/8X2-3/4 SM (GAUZE/BANDAGES/DRESSINGS) ×8 IMPLANT
DRSG TELFA 3X8 NADH (GAUZE/BANDAGES/DRESSINGS) ×2 IMPLANT
ENDOLOOP SUT PDS II  0 18 (SUTURE)
ENDOLOOP SUT PDS II 0 18 (SUTURE) IMPLANT
ENDOPOUCH RETRIEVER 10 (MISCELLANEOUS) ×2 IMPLANT
GLOVE BIO SURGEON STRL SZ7.5 (GLOVE) ×8 IMPLANT
GOWN STRL REUS W/ TWL LRG LVL3 (GOWN DISPOSABLE) ×3 IMPLANT
GOWN STRL REUS W/TWL LRG LVL3 (GOWN DISPOSABLE) ×3
IRRIGATION STRYKERFLOW (MISCELLANEOUS) ×1 IMPLANT
IRRIGATOR STRYKERFLOW (MISCELLANEOUS) ×2
IV CATH ANGIO 12GX3 LT BLUE (NEEDLE) IMPLANT
IV NS 1000ML (IV SOLUTION) ×1
IV NS 1000ML BAXH (IV SOLUTION) ×1 IMPLANT
LABEL OR SOLS (LABEL) ×2 IMPLANT
LIQUID BAND (GAUZE/BANDAGES/DRESSINGS) IMPLANT
NEEDLE HYPO 25X1 1.5 SAFETY (NEEDLE) ×2 IMPLANT
NS IRRIG 500ML POUR BTL (IV SOLUTION) ×2 IMPLANT
PACK LAP CHOLECYSTECTOMY (MISCELLANEOUS) ×2 IMPLANT
PAD GROUND ADULT SPLIT (MISCELLANEOUS) ×2 IMPLANT
SCISSORS METZENBAUM CVD 33 (INSTRUMENTS) ×2 IMPLANT
SEAL FOR SCOPE WARMER C3101 (MISCELLANEOUS) IMPLANT
SLEEVE ENDOPATH XCEL 5M (ENDOMECHANICALS) ×2 IMPLANT
STRAP SAFETY BODY (MISCELLANEOUS) ×2 IMPLANT
STRIP CLOSURE SKIN 1/2X4 (GAUZE/BANDAGES/DRESSINGS) ×2 IMPLANT
SUT MNCRL 4-0 (SUTURE) ×1
SUT MNCRL 4-0 27XMFL (SUTURE) ×1
SUT VICRYL 0 AB UR-6 (SUTURE) ×2 IMPLANT
SUTURE MNCRL 4-0 27XMF (SUTURE) ×1 IMPLANT
SWABSTK COMLB BENZOIN TINCTURE (MISCELLANEOUS) IMPLANT
TROCAR XCEL BLUNT TIP 100MML (ENDOMECHANICALS) ×2 IMPLANT
TROCAR XCEL NON-BLD 11X100MML (ENDOMECHANICALS) ×2 IMPLANT
TROCAR XCEL NON-BLD 5MMX100MML (ENDOMECHANICALS) ×2 IMPLANT
TUBING INSUFFLATOR HI FLOW (MISCELLANEOUS) ×2 IMPLANT
WATER STERILE IRR 1000ML POUR (IV SOLUTION) IMPLANT

## 2015-02-16 NOTE — Progress Notes (Signed)
I have seen and evaluated patient.  No acute issues.  No changes to H and P.  Proceed with surgery. 

## 2015-02-16 NOTE — Anesthesia Procedure Notes (Signed)
Procedure Name: Intubation Date/Time: 02/16/2015 9:18 AM Performed by: Shirlee Limerick, Mykah Shin Pre-anesthesia Checklist: Patient identified, Emergency Drugs available, Suction available and Patient being monitored Patient Re-evaluated:Patient Re-evaluated prior to inductionOxygen Delivery Method: Circle system utilized Preoxygenation: Pre-oxygenation with 100% oxygen Intubation Type: IV induction Laryngoscope Size: Mac and 3 Grade View: Grade I Tube type: Oral Tube size: 7.0 mm Number of attempts: 1 Placement Confirmation: ETT inserted through vocal cords under direct vision,  positive ETCO2 and breath sounds checked- equal and bilateral Secured at: 22 cm Tube secured with: Tape Dental Injury: Teeth and Oropharynx as per pre-operative assessment

## 2015-02-16 NOTE — Anesthesia Postprocedure Evaluation (Signed)
  Anesthesia Post-op Note  Patient: Michele Hammond  Procedure(s) Performed: Procedure(s): LAPAROSCOPIC CHOLECYSTECTOMY (N/A)  Anesthesia type:General  Patient location: PACU  Post pain: Pain level controlled  Post assessment: Post-op Vital signs reviewed, Patient's Cardiovascular Status Stable, Respiratory Function Stable, Patent Airway and No signs of Nausea or vomiting  Post vital signs: Reviewed and stable  Last Vitals:  Filed Vitals:   02/16/15 1132  BP: 124/78  Pulse: 84  Temp:   Resp: 20    Level of consciousness: awake, alert  and patient cooperative  Complications: No apparent anesthesia complications

## 2015-02-16 NOTE — Discharge Instructions (Signed)
Do not drive on pain medications °Do not lift greater than 15 lbs for a period of 6 weeks °Call or return to ER if you develop fever greater than 101.5, nausea/vomiting, increased pain, redness/drainage from incisions °Take bandages off in 48 hours.  Okay to shower with bandages on or after they come off, no tub baths ° ° °AMBULATORY SURGERY  °DISCHARGE INSTRUCTIONS ° ° °1) The drugs that you were given will stay in your system until tomorrow so for the next 24 hours you should not: ° °A) Drive an automobile °B) Make any legal decisions °C) Drink any alcoholic beverage ° ° °2) You may resume regular meals tomorrow.  Today it is better to start with liquids and gradually work up to solid foods. ° °You may eat anything you prefer, but it is better to start with liquids, then soup and crackers, and gradually work up to solid foods. ° ° °3) Please notify your doctor immediately if you have any unusual bleeding, trouble breathing, redness and pain at the surgery site, drainage, fever, or pain not relieved by medication. ° ° ° °4) Additional Instructions: ° ° ° ° ° ° ° °Please contact your physician with any problems or Same Day Surgery at [phone], Monday through Friday 6 am to 4 pm, or [place] [facility] number at [phone]. °

## 2015-02-16 NOTE — Anesthesia Preprocedure Evaluation (Signed)
Anesthesia Evaluation  Patient identified by MRN, date of birth, ID band Patient awake    Reviewed: Allergy & Precautions, NPO status , Patient's Chart, lab work & pertinent test results  History of Anesthesia Complications Negative for: history of anesthetic complications  Airway Mallampati: II  TM Distance: >3 FB Neck ROM: Full    Dental  (+) Teeth Intact   Pulmonary          Cardiovascular + dysrhythmias (as a child some irregular beats) + Valvular Problems/Murmurs (pt denies)     Neuro/Psych Depression    GI/Hepatic GERD-  Medicated,(+) Hepatitis -, C  Endo/Other  negative endocrine ROS  Renal/GU negative Renal ROS     Musculoskeletal   Abdominal   Peds  Hematology negative hematology ROS (+)   Anesthesia Other Findings   Reproductive/Obstetrics                             Anesthesia Physical Anesthesia Plan  ASA: II  Anesthesia Plan: General   Post-op Pain Management:    Induction: Intravenous  Airway Management Planned: Oral ETT  Additional Equipment:   Intra-op Plan:   Post-operative Plan:   Informed Consent: I have reviewed the patients History and Physical, chart, labs and discussed the procedure including the risks, benefits and alternatives for the proposed anesthesia with the patient or authorized representative who has indicated his/her understanding and acceptance.     Plan Discussed with:   Anesthesia Plan Comments:         Anesthesia Quick Evaluation

## 2015-02-16 NOTE — Brief Op Note (Signed)
02/16/2015  10:17 AM  PATIENT:  Michele Hammond  54 y.o. female  PRE-OPERATIVE DIAGNOSIS:  Cholelithiasis  POST-OPERATIVE DIAGNOSIS:  Cholelithiasis  PROCEDURE:  Procedure(s): LAPAROSCOPIC CHOLECYSTECTOMY (N/A)  SURGEON:  Surgeon(s) and Role:    * Ida Rogue, MD - Primary  PHYSICIAN ASSISTANT:   ASSISTANTS: none   ANESTHESIA:   general  EBL:  Total I/O In: 750 [I.V.:750] Out: 15 [Blood:15]  BLOOD ADMINISTERED:none  DRAINS: none   LOCAL MEDICATIONS USED:  LIDOCAINE   SPECIMEN:  Excision  DISPOSITION OF SPECIMEN:  PATHOLOGY  COUNTS:  YES  TOURNIQUET:  * No tourniquets in log *  DICTATION: .Note written in EPIC  PLAN OF CARE: Discharge to home after PACU  PATIENT DISPOSITION:  PACU - hemodynamically stable.   Delay start of Pharmacological VTE agent (>24hrs) due to surgical blood loss or risk of bleeding: not applicable

## 2015-02-16 NOTE — Op Note (Signed)
Preop dx: Symptomatic cholelithiasis Postop dx: Same Procedure performed: Laparoscopic cholecystectomy Anesthesia: General EBL: 15 ml Complications: None Specimen: gallbladder  Indication for surgery: Ms Neidert i is a pleasant 54 yo F who presents with recurrent RUQ pain and gallstones. Her pain thought to be biliary in nature so I offered cholecystectomy.  Details of surgery: Informed consent was obtained.  Ms. Hoey was brought to the OR suite and laid supine on the OR table.  She was induced, ETT was placed, general anesthesia was administered.  Her abdomen was prepped and draped.  A timeout was performed correctly identifying patient name, operative site and procedure to be performed.  A supraumbilical incision was made and deepened to the fascia.  The fascia was incised, peritoneum was entered.  Two stay sutures were placed through the fasciotomy.  Hassan trocar was placed and abdomen was insufflated.  An 11mm epigastric and 2 5 mm subcostal trocars were placed.  Gallbladder was minimally inflamed.  Cystic duct and cystic artery were dissected out and critical view was obtained.  Cystic artery and cystic duct were clipped and ligated..  Gallbladder was then taken off fossa and removed through umbilicus. Fossa was made hemostatic and irrigated until hemostasis obtained.  Trocars were then removed and abdomen desufflated.  Supraumbilical fascia was then closed with previously placed stay sutures.  Skin was then closed with interrupted deep dermal 4-0 monocryl.  Suture strips, telfa and tegaderm were used to dress incision.  Patient was then awoken, extubated and brought to PACU.  There were no immediate complications. Needle, sponge and instrument count was correct at the end of the procedure.

## 2015-02-16 NOTE — Transfer of Care (Signed)
Immediate Anesthesia Transfer of Care Note  Patient: Michele Hammond  Procedure(s) Performed: Procedure(s): LAPAROSCOPIC CHOLECYSTECTOMY (N/A)  Patient Location: PACU  Anesthesia Type:General  Level of Consciousness: sedated  Airway & Oxygen Therapy: Patient Spontanous Breathing and Patient connected to nasal cannula oxygen  Post-op Assessment: Report given to RN and Post -op Vital signs reviewed and stable  Post vital signs: Reviewed and stable  Last Vitals:  Filed Vitals:   02/16/15 1024  BP: 128/78  Pulse:   Temp: 36.9 C  Resp: 10    Complications: No apparent anesthesia complications

## 2015-02-17 LAB — SURGICAL PATHOLOGY

## 2015-02-25 ENCOUNTER — Encounter: Payer: Self-pay | Admitting: Surgery

## 2015-02-25 ENCOUNTER — Ambulatory Visit (INDEPENDENT_AMBULATORY_CARE_PROVIDER_SITE_OTHER): Payer: 59 | Admitting: Surgery

## 2015-02-25 VITALS — BP 127/84 | HR 84 | Temp 98.2°F | Ht 63.0 in | Wt 176.4 lb

## 2015-02-25 DIAGNOSIS — Z09 Encounter for follow-up examination after completed treatment for conditions other than malignant neoplasm: Secondary | ICD-10-CM

## 2015-02-25 NOTE — Patient Instructions (Signed)
You may return to work with restrictions on 03/08/15 and then back to no restrictions on 03/22/15. (Please see work note provided)  If you have develop increased pain, redness at incision sites, or fever, please call our office.

## 2015-02-25 NOTE — Progress Notes (Signed)
Surgery Clinic Note  S: No acute issues.  Some bloating with PO but this is not new O:Blood pressure 127/84, pulse 84, temperature 98.2 F (36.8 C), temperature source Oral, height  (1.6 m), weight 176 lb 6.4 oz (80.015 kg), last menstrual period 11/19/2013. GEN: NAD/A&Ox3 ABD: soft, nontender, nondistended, incisions c/d/i  A/P s/p lap chole, doing well - no acute issues - no heavy lifting x 5 weeks

## 2015-02-26 ENCOUNTER — Telehealth: Payer: Self-pay

## 2015-02-26 NOTE — Telephone Encounter (Signed)
Patient called asking for a work note letting her go to a meeting on 03/02/2015 from 4-5:00PM that is required at her job. I told her that I would. Please read letter that was sent.

## 2015-03-02 ENCOUNTER — Other Ambulatory Visit: Payer: Self-pay | Admitting: Surgery

## 2015-06-24 DIAGNOSIS — F349 Persistent mood [affective] disorder, unspecified: Secondary | ICD-10-CM | POA: Diagnosis not present

## 2015-07-01 DIAGNOSIS — F349 Persistent mood [affective] disorder, unspecified: Secondary | ICD-10-CM | POA: Diagnosis not present

## 2015-07-08 DIAGNOSIS — F349 Persistent mood [affective] disorder, unspecified: Secondary | ICD-10-CM | POA: Diagnosis not present

## 2015-07-15 DIAGNOSIS — F349 Persistent mood [affective] disorder, unspecified: Secondary | ICD-10-CM | POA: Diagnosis not present

## 2015-07-22 DIAGNOSIS — F349 Persistent mood [affective] disorder, unspecified: Secondary | ICD-10-CM | POA: Diagnosis not present

## 2015-07-29 DIAGNOSIS — F349 Persistent mood [affective] disorder, unspecified: Secondary | ICD-10-CM | POA: Diagnosis not present

## 2015-08-05 DIAGNOSIS — F349 Persistent mood [affective] disorder, unspecified: Secondary | ICD-10-CM | POA: Diagnosis not present

## 2015-08-07 DIAGNOSIS — H524 Presbyopia: Secondary | ICD-10-CM | POA: Diagnosis not present

## 2015-08-12 DIAGNOSIS — F349 Persistent mood [affective] disorder, unspecified: Secondary | ICD-10-CM | POA: Diagnosis not present

## 2015-08-19 DIAGNOSIS — F349 Persistent mood [affective] disorder, unspecified: Secondary | ICD-10-CM | POA: Diagnosis not present

## 2015-09-02 DIAGNOSIS — F349 Persistent mood [affective] disorder, unspecified: Secondary | ICD-10-CM | POA: Diagnosis not present

## 2015-09-09 DIAGNOSIS — F349 Persistent mood [affective] disorder, unspecified: Secondary | ICD-10-CM | POA: Diagnosis not present

## 2015-09-14 DIAGNOSIS — J301 Allergic rhinitis due to pollen: Secondary | ICD-10-CM | POA: Diagnosis not present

## 2015-09-14 DIAGNOSIS — R0982 Postnasal drip: Secondary | ICD-10-CM | POA: Diagnosis not present

## 2015-09-14 DIAGNOSIS — J328 Other chronic sinusitis: Secondary | ICD-10-CM | POA: Diagnosis not present

## 2015-09-16 DIAGNOSIS — F349 Persistent mood [affective] disorder, unspecified: Secondary | ICD-10-CM | POA: Diagnosis not present

## 2015-09-23 DIAGNOSIS — F349 Persistent mood [affective] disorder, unspecified: Secondary | ICD-10-CM | POA: Diagnosis not present

## 2015-09-28 DIAGNOSIS — J301 Allergic rhinitis due to pollen: Secondary | ICD-10-CM | POA: Diagnosis not present

## 2015-09-30 DIAGNOSIS — F349 Persistent mood [affective] disorder, unspecified: Secondary | ICD-10-CM | POA: Diagnosis not present

## 2015-10-07 DIAGNOSIS — F349 Persistent mood [affective] disorder, unspecified: Secondary | ICD-10-CM | POA: Diagnosis not present

## 2015-10-14 DIAGNOSIS — F349 Persistent mood [affective] disorder, unspecified: Secondary | ICD-10-CM | POA: Diagnosis not present

## 2015-10-21 DIAGNOSIS — F349 Persistent mood [affective] disorder, unspecified: Secondary | ICD-10-CM | POA: Diagnosis not present

## 2015-10-28 DIAGNOSIS — F349 Persistent mood [affective] disorder, unspecified: Secondary | ICD-10-CM | POA: Diagnosis not present

## 2015-11-04 DIAGNOSIS — F349 Persistent mood [affective] disorder, unspecified: Secondary | ICD-10-CM | POA: Diagnosis not present

## 2015-11-11 DIAGNOSIS — F349 Persistent mood [affective] disorder, unspecified: Secondary | ICD-10-CM | POA: Diagnosis not present

## 2015-11-18 ENCOUNTER — Ambulatory Visit: Payer: Self-pay | Admitting: Physician Assistant

## 2015-11-18 ENCOUNTER — Encounter: Payer: Self-pay | Admitting: Physician Assistant

## 2015-11-18 VITALS — BP 114/80 | HR 74 | Temp 97.9°F

## 2015-11-18 DIAGNOSIS — S91339A Puncture wound without foreign body, unspecified foot, initial encounter: Secondary | ICD-10-CM

## 2015-11-18 DIAGNOSIS — F349 Persistent mood [affective] disorder, unspecified: Secondary | ICD-10-CM | POA: Diagnosis not present

## 2015-11-18 MED ORDER — DOXYCYCLINE HYCLATE 100 MG PO TABS: 100.0000 mg | ORAL_TABLET | Freq: Two times a day (BID) | ORAL | Status: DC

## 2015-11-18 NOTE — Patient Instructions (Signed)
Puncture Wound °A puncture wound is an injury that is caused by a sharp, thin object that goes through your skin, such as a nail. A puncture wound usually does not leave a large opening in your skin, so it may not bleed a lot. However, when you get a puncture wound, dirt or other materials (foreign bodies) can be forced into your wound and break off inside. This makes it more likely that an infection will happen, such as tetanus. °HOME CARE °Medicines  °· Take or apply over-the-counter and prescription medicines only as told by your doctor. °· If you were prescribed an antibiotic medicine, take or apply it as told by your doctor. Do not stop using the antibiotic even if your condition starts to get better. °Wound Care  °· There are many ways to close and cover a wound. For example, a wound can be covered with stitches (sutures), skin glue, or adhesive strips. Follow instructions from your doctor about: °¨ How to take care of your wound. °¨ When and how you should change your bandage (dressing). °¨ When you should remove your bandage. °¨ Removing whatever was used to close your wound. °· Keep the bandage dry as told by your doctor. Do not take baths, swim, use a hot tub, or do anything that would put your wound underwater until your doctor says it is okay. °· Clean the wound as told by your doctor. °· Do not scratch or pick at the wound. °· Check your wound every day for signs of infection. Watch for: °¨ Redness, swelling, or pain. °¨ Fluid, blood, or pus. °General Instructions  °· Raise (elevate) the injured area above the level of your heart while you are sitting or lying down. °· If your puncture wound is in your foot, ask your doctor if you need to avoid putting weight on your foot and for how long. °· Keep all follow-up visits as told by your doctor. This is important. °GET HELP IF: °· You got a tetanus shot and you have any of these problems at the injection site: °¨ Swelling. °¨ Very bad  pain. °¨ Redness. °¨ Bleeding. °· You have a fever. °· Your stitches come out. °· You notice a bad smell coming from your wound or your bandage. °· You notice something coming out of the wound, such as wood or glass. °· Medicine does not help your pain. °· You have more redness, swelling, or pain at the site of your wound. °· You have fluid, blood, or pus coming from your wound. °· You notice a change in the color of your skin near your wound. °· You need to change the bandage often because fluid, blood, or pus is coming from the wound. °· You start to have a new rash. °· You start to have numbness around the wound. °GET HELP RIGHT AWAY IF: °· You have very bad swelling around the wound. °· Your pain suddenly gets worse and is very bad. °· You start to get painful skin lumps. °· You have a red streak going away from your wound. °· The wound is on your hand or foot and you cannot move a finger or toe like you usually can. °· The wound is on your hand or foot and you notice that your fingers or toes look pale or bluish. °  °This information is not intended to replace advice given to you by your health care provider. Make sure you discuss any questions you have with your health care provider. °  °Document   Released: 03/14/2008 Document Revised: 02/24/2015 Document Reviewed: 07/29/2014 °Elsevier Interactive Patient Education ©2016 [employer] ° °

## 2015-11-18 NOTE — Progress Notes (Signed)
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

## 2015-11-19 DIAGNOSIS — S91132A Puncture wound without foreign body of left great toe without damage to nail, initial encounter: Secondary | ICD-10-CM | POA: Diagnosis not present

## 2015-11-19 DIAGNOSIS — S91102A Unspecified open wound of left great toe without damage to nail, initial encounter: Secondary | ICD-10-CM | POA: Diagnosis not present

## 2015-11-19 DIAGNOSIS — L03032 Cellulitis of left toe: Secondary | ICD-10-CM | POA: Diagnosis not present

## 2015-12-02 DIAGNOSIS — F349 Persistent mood [affective] disorder, unspecified: Secondary | ICD-10-CM | POA: Diagnosis not present

## 2015-12-08 DIAGNOSIS — F349 Persistent mood [affective] disorder, unspecified: Secondary | ICD-10-CM | POA: Diagnosis not present

## 2015-12-16 DIAGNOSIS — F349 Persistent mood [affective] disorder, unspecified: Secondary | ICD-10-CM | POA: Diagnosis not present

## 2015-12-29 ENCOUNTER — Encounter: Payer: Self-pay | Admitting: Physician Assistant

## 2015-12-29 ENCOUNTER — Ambulatory Visit: Payer: Self-pay | Admitting: Physician Assistant

## 2015-12-29 VITALS — BP 130/90 | HR 86 | Temp 98.3°F

## 2015-12-29 DIAGNOSIS — J0120 Acute ethmoidal sinusitis, unspecified: Secondary | ICD-10-CM

## 2015-12-29 DIAGNOSIS — J029 Acute pharyngitis, unspecified: Secondary | ICD-10-CM

## 2015-12-29 MED ORDER — PSEUDOEPH-BROMPHEN-DM 30-2-10 MG/5ML PO SYRP: 5.0000 mL | ORAL_SOLUTION | Freq: Four times a day (QID) | ORAL | Status: DC | PRN

## 2015-12-29 MED ORDER — CIPROFLOXACIN HCL 500 MG PO TABS: 500.0000 mg | ORAL_TABLET | Freq: Two times a day (BID) | ORAL | Status: DC

## 2015-12-29 MED ORDER — MAGIC MOUTHWASH W/LIDOCAINE: 5.0000 mL | Freq: Four times a day (QID) | ORAL | Status: DC

## 2015-12-29 NOTE — Progress Notes (Signed)
   Subjective:cough/Sore throat    Patient ID: Michele Hammond, female    DOB: 09-13-1960, 55 y.o.   MRN: [id number]  HPI Patient c/o one week of productive cough. States sputum turn green yesterday. Also c/o sore throat for 5 days. Denies fever/chill, or N/V/D.Mild relief with OTC medication. 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    Review of Systems    Hep C Objective:   Physical Exam No acute distress. Bilatera Maxillary guarding with edematous nasal turbinates. Post nasal drainage with erythematous pharnyx. Neck supple without cervical adenopathy. Lungs with right Rales.Heart RRR.       Assessment & Plan:Sinusitis/Pharyngitis  Cipro, Bromfed DM, and Magic mouthwash. Follow up 5 days .

## 2016-01-03 ENCOUNTER — Ambulatory Visit: Payer: Self-pay | Admitting: Physician Assistant

## 2016-01-03 ENCOUNTER — Encounter: Payer: Self-pay | Admitting: Physician Assistant

## 2016-01-03 VITALS — BP 114/84 | HR 88 | Temp 98.5°F

## 2016-01-03 DIAGNOSIS — J0121 Acute recurrent ethmoidal sinusitis: Secondary | ICD-10-CM

## 2016-01-03 DIAGNOSIS — J209 Acute bronchitis, unspecified: Secondary | ICD-10-CM

## 2016-01-03 NOTE — Progress Notes (Signed)
S: here for reck of lungs.  Feels better and taking medication without any problems  O: HENT wnl   Neck supple w/o aden.  Lungs clear bilat   Heart rrr A: Improving Sinusitis/bronchitis P: continue meds until finished

## 2016-01-13 DIAGNOSIS — F349 Persistent mood [affective] disorder, unspecified: Secondary | ICD-10-CM | POA: Diagnosis not present

## 2016-01-18 DIAGNOSIS — R0982 Postnasal drip: Secondary | ICD-10-CM | POA: Diagnosis not present

## 2016-01-18 DIAGNOSIS — J301 Allergic rhinitis due to pollen: Secondary | ICD-10-CM | POA: Diagnosis not present

## 2016-01-20 DIAGNOSIS — F349 Persistent mood [affective] disorder, unspecified: Secondary | ICD-10-CM | POA: Diagnosis not present

## 2016-01-27 DIAGNOSIS — F349 Persistent mood [affective] disorder, unspecified: Secondary | ICD-10-CM | POA: Diagnosis not present

## 2016-02-03 DIAGNOSIS — F349 Persistent mood [affective] disorder, unspecified: Secondary | ICD-10-CM | POA: Diagnosis not present

## 2016-02-10 DIAGNOSIS — F349 Persistent mood [affective] disorder, unspecified: Secondary | ICD-10-CM | POA: Diagnosis not present

## 2016-02-17 DIAGNOSIS — F349 Persistent mood [affective] disorder, unspecified: Secondary | ICD-10-CM | POA: Diagnosis not present

## 2016-02-24 DIAGNOSIS — F349 Persistent mood [affective] disorder, unspecified: Secondary | ICD-10-CM | POA: Diagnosis not present

## 2016-02-29 DIAGNOSIS — S6991XA Unspecified injury of right wrist, hand and finger(s), initial encounter: Secondary | ICD-10-CM | POA: Diagnosis not present

## 2016-02-29 DIAGNOSIS — M7989 Other specified soft tissue disorders: Secondary | ICD-10-CM | POA: Diagnosis not present

## 2016-02-29 DIAGNOSIS — M79641 Pain in right hand: Secondary | ICD-10-CM | POA: Diagnosis not present

## 2016-03-02 DIAGNOSIS — F349 Persistent mood [affective] disorder, unspecified: Secondary | ICD-10-CM | POA: Diagnosis not present

## 2016-03-09 DIAGNOSIS — F349 Persistent mood [affective] disorder, unspecified: Secondary | ICD-10-CM | POA: Diagnosis not present

## 2016-03-23 DIAGNOSIS — F349 Persistent mood [affective] disorder, unspecified: Secondary | ICD-10-CM | POA: Diagnosis not present

## 2016-03-30 DIAGNOSIS — F349 Persistent mood [affective] disorder, unspecified: Secondary | ICD-10-CM | POA: Diagnosis not present

## 2016-04-03 ENCOUNTER — Encounter: Payer: Self-pay | Admitting: Emergency Medicine

## 2016-04-03 ENCOUNTER — Emergency Department
Admission: EM | Admit: 2016-04-03 | Discharge: 2016-04-03 | Disposition: A | Discharge status: Home / Self Care | Payer: 59 | Attending: Emergency Medicine | Admitting: Emergency Medicine

## 2016-04-03 ENCOUNTER — Emergency Department: Payer: 59

## 2016-04-03 DIAGNOSIS — Y9389 Activity, other specified: Secondary | ICD-10-CM | POA: Diagnosis not present

## 2016-04-03 DIAGNOSIS — Z79899 Other long term (current) drug therapy: Secondary | ICD-10-CM | POA: Diagnosis not present

## 2016-04-03 DIAGNOSIS — S060X0A Concussion without loss of consciousness, initial encounter: Secondary | ICD-10-CM | POA: Diagnosis not present

## 2016-04-03 DIAGNOSIS — Y9289 Other specified places as the place of occurrence of the external cause: Secondary | ICD-10-CM | POA: Diagnosis not present

## 2016-04-03 DIAGNOSIS — S0990XA Unspecified injury of head, initial encounter: Secondary | ICD-10-CM | POA: Diagnosis not present

## 2016-04-03 DIAGNOSIS — R6889 Other general symptoms and signs: Secondary | ICD-10-CM | POA: Diagnosis not present

## 2016-04-03 DIAGNOSIS — S0001XA Abrasion of scalp, initial encounter: Secondary | ICD-10-CM | POA: Diagnosis not present

## 2016-04-03 DIAGNOSIS — S0003XA Contusion of scalp, initial encounter: Secondary | ICD-10-CM | POA: Diagnosis not present

## 2016-04-03 DIAGNOSIS — S098XXA Other specified injuries of head, initial encounter: Secondary | ICD-10-CM | POA: Diagnosis not present

## 2016-04-03 DIAGNOSIS — Y999 Unspecified external cause status: Secondary | ICD-10-CM | POA: Diagnosis not present

## 2016-04-03 DIAGNOSIS — M542 Cervicalgia: Secondary | ICD-10-CM | POA: Diagnosis not present

## 2016-04-03 MED ORDER — HYDROCODONE-ACETAMINOPHEN 5-325 MG PO TABS: 1.0000 | ORAL_TABLET | ORAL | 0 refills | Status: DC | PRN
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
## 2016-04-03 NOTE — Discharge Instructions (Signed)
Follow-up with your primary care doctor if any continued problems. Repeat information about concussion and head injuries and return to the emergency room if any sudden changes or worsening of your symptoms. Remember that any symptoms that we discussed such as visual changes, nausea and vomiting, projectile vomiting, difficulty talking, walking or severe worsening of your symptoms return to the emergency room immediately. Take Tylenol today for your headache and pain. Use ice to your scalp. The prescription for Norco may take tomorrow for pain if needed. Do not drive or work while taking pain medication.

## 2016-04-06 DIAGNOSIS — F349 Persistent mood [affective] disorder, unspecified: Secondary | ICD-10-CM | POA: Diagnosis not present

## 2016-04-13 DIAGNOSIS — F349 Persistent mood [affective] disorder, unspecified: Secondary | ICD-10-CM | POA: Diagnosis not present

## 2016-04-18 DIAGNOSIS — F349 Persistent mood [affective] disorder, unspecified: Secondary | ICD-10-CM | POA: Diagnosis not present

## 2016-04-27 DIAGNOSIS — F349 Persistent mood [affective] disorder, unspecified: Secondary | ICD-10-CM | POA: Diagnosis not present

## 2016-05-04 DIAGNOSIS — F349 Persistent mood [affective] disorder, unspecified: Secondary | ICD-10-CM | POA: Diagnosis not present

## 2016-05-09 DIAGNOSIS — F349 Persistent mood [affective] disorder, unspecified: Secondary | ICD-10-CM | POA: Diagnosis not present

## 2016-05-18 DIAGNOSIS — S0990XA Unspecified injury of head, initial encounter: Secondary | ICD-10-CM | POA: Diagnosis not present

## 2016-05-18 DIAGNOSIS — F349 Persistent mood [affective] disorder, unspecified: Secondary | ICD-10-CM | POA: Diagnosis not present

## 2016-05-25 DIAGNOSIS — F349 Persistent mood [affective] disorder, unspecified: Secondary | ICD-10-CM | POA: Diagnosis not present

## 2016-05-25 DIAGNOSIS — S098XXA Other specified injuries of head, initial encounter: Secondary | ICD-10-CM | POA: Diagnosis not present

## 2016-06-01 DIAGNOSIS — S098XXA Other specified injuries of head, initial encounter: Secondary | ICD-10-CM | POA: Diagnosis not present

## 2016-06-01 DIAGNOSIS — F349 Persistent mood [affective] disorder, unspecified: Secondary | ICD-10-CM | POA: Diagnosis not present

## 2016-06-08 DIAGNOSIS — S098XXA Other specified injuries of head, initial encounter: Secondary | ICD-10-CM | POA: Diagnosis not present

## 2016-06-08 DIAGNOSIS — F349 Persistent mood [affective] disorder, unspecified: Secondary | ICD-10-CM | POA: Diagnosis not present

## 2016-06-22 DIAGNOSIS — F349 Persistent mood [affective] disorder, unspecified: Secondary | ICD-10-CM | POA: Diagnosis not present

## 2016-06-29 DIAGNOSIS — F349 Persistent mood [affective] disorder, unspecified: Secondary | ICD-10-CM | POA: Diagnosis not present

## 2016-07-13 DIAGNOSIS — F349 Persistent mood [affective] disorder, unspecified: Secondary | ICD-10-CM | POA: Diagnosis not present

## 2016-07-20 DIAGNOSIS — F349 Persistent mood [affective] disorder, unspecified: Secondary | ICD-10-CM | POA: Diagnosis not present

## 2016-08-03 DIAGNOSIS — F349 Persistent mood [affective] disorder, unspecified: Secondary | ICD-10-CM | POA: Diagnosis not present

## 2016-08-10 DIAGNOSIS — F349 Persistent mood [affective] disorder, unspecified: Secondary | ICD-10-CM | POA: Diagnosis not present

## 2016-08-17 DIAGNOSIS — F349 Persistent mood [affective] disorder, unspecified: Secondary | ICD-10-CM | POA: Diagnosis not present

## 2016-08-31 DIAGNOSIS — F349 Persistent mood [affective] disorder, unspecified: Secondary | ICD-10-CM | POA: Diagnosis not present

## 2016-09-07 DIAGNOSIS — F349 Persistent mood [affective] disorder, unspecified: Secondary | ICD-10-CM | POA: Diagnosis not present

## 2016-09-14 DIAGNOSIS — F349 Persistent mood [affective] disorder, unspecified: Secondary | ICD-10-CM | POA: Diagnosis not present

## 2016-09-21 DIAGNOSIS — F349 Persistent mood [affective] disorder, unspecified: Secondary | ICD-10-CM | POA: Diagnosis not present

## 2016-09-28 DIAGNOSIS — F349 Persistent mood [affective] disorder, unspecified: Secondary | ICD-10-CM | POA: Diagnosis not present

## 2016-10-05 DIAGNOSIS — F349 Persistent mood [affective] disorder, unspecified: Secondary | ICD-10-CM | POA: Diagnosis not present

## 2016-10-11 ENCOUNTER — Ambulatory Visit: Payer: Self-pay | Admitting: Physician Assistant

## 2016-10-11 ENCOUNTER — Encounter: Payer: Self-pay | Admitting: Physician Assistant

## 2016-10-11 VITALS — BP 130/100 | HR 90 | Temp 98.4°F

## 2016-10-11 DIAGNOSIS — R509 Fever, unspecified: Secondary | ICD-10-CM

## 2016-10-11 LAB — POCT INFLUENZA A/B
INFLUENZA B, POC: NEGATIVE
Influenza A, POC: NEGATIVE

## 2016-10-11 LAB — POCT RAPID STREP A (OFFICE): RAPID STREP A SCREEN: NEGATIVE

## 2016-10-11 MED ORDER — AZITHROMYCIN 250 MG PO TABS: ORAL_TABLET | ORAL | 0 refills | Status: DC

## 2016-10-11 NOTE — Progress Notes (Signed)
S: C/o runny nose and congestion with sore throat for 2 weeks, + fever, chills last night and today, some body aches yesterday and today, denies cp/sob, v/d; mucus was green this am but clear throughout the day, cough is sporadic, 2 weeks ago had white pus on her tonsils but didn't get checked  Using otc meds: robitussin  O: PE: vitals wnl, nad,  perrl eomi, normocephalic, tms dull, nasal mucosa red and swollen, throat injected, neck supple no lymph, lungs c t a, cv rrr, neuro intact, flu swab neg , q strep neg  A:  Acute uri  P: drink fluids, continue regular meds , use otc meds of choice, return if not improving in 5 days, return earlier if worsening , zpack

## 2016-10-12 DIAGNOSIS — F349 Persistent mood [affective] disorder, unspecified: Secondary | ICD-10-CM | POA: Diagnosis not present

## 2016-10-19 DIAGNOSIS — F349 Persistent mood [affective] disorder, unspecified: Secondary | ICD-10-CM | POA: Diagnosis not present

## 2016-10-26 DIAGNOSIS — F349 Persistent mood [affective] disorder, unspecified: Secondary | ICD-10-CM | POA: Diagnosis not present

## 2016-11-02 DIAGNOSIS — F349 Persistent mood [affective] disorder, unspecified: Secondary | ICD-10-CM | POA: Diagnosis not present

## 2016-11-07 DIAGNOSIS — F349 Persistent mood [affective] disorder, unspecified: Secondary | ICD-10-CM | POA: Diagnosis not present

## 2016-11-16 DIAGNOSIS — F349 Persistent mood [affective] disorder, unspecified: Secondary | ICD-10-CM | POA: Diagnosis not present

## 2016-11-23 DIAGNOSIS — F349 Persistent mood [affective] disorder, unspecified: Secondary | ICD-10-CM | POA: Diagnosis not present

## 2016-11-30 DIAGNOSIS — F349 Persistent mood [affective] disorder, unspecified: Secondary | ICD-10-CM | POA: Diagnosis not present

## 2016-12-07 DIAGNOSIS — F349 Persistent mood [affective] disorder, unspecified: Secondary | ICD-10-CM | POA: Diagnosis not present

## 2016-12-14 DIAGNOSIS — F349 Persistent mood [affective] disorder, unspecified: Secondary | ICD-10-CM | POA: Diagnosis not present

## 2016-12-21 DIAGNOSIS — F349 Persistent mood [affective] disorder, unspecified: Secondary | ICD-10-CM | POA: Diagnosis not present

## 2017-01-25 DIAGNOSIS — F349 Persistent mood [affective] disorder, unspecified: Secondary | ICD-10-CM | POA: Diagnosis not present

## 2017-02-01 DIAGNOSIS — F349 Persistent mood [affective] disorder, unspecified: Secondary | ICD-10-CM | POA: Diagnosis not present

## 2017-02-15 DIAGNOSIS — F349 Persistent mood [affective] disorder, unspecified: Secondary | ICD-10-CM | POA: Diagnosis not present

## 2017-02-22 DIAGNOSIS — F349 Persistent mood [affective] disorder, unspecified: Secondary | ICD-10-CM | POA: Diagnosis not present

## 2017-03-08 DIAGNOSIS — F349 Persistent mood [affective] disorder, unspecified: Secondary | ICD-10-CM | POA: Diagnosis not present

## 2017-03-13 DIAGNOSIS — F349 Persistent mood [affective] disorder, unspecified: Secondary | ICD-10-CM | POA: Diagnosis not present

## 2017-03-22 DIAGNOSIS — F349 Persistent mood [affective] disorder, unspecified: Secondary | ICD-10-CM | POA: Diagnosis not present

## 2017-03-28 DIAGNOSIS — J3489 Other specified disorders of nose and nasal sinuses: Secondary | ICD-10-CM | POA: Diagnosis not present

## 2017-03-28 DIAGNOSIS — J328 Other chronic sinusitis: Secondary | ICD-10-CM | POA: Diagnosis not present

## 2017-03-28 DIAGNOSIS — J301 Allergic rhinitis due to pollen: Secondary | ICD-10-CM | POA: Diagnosis not present

## 2017-03-29 DIAGNOSIS — F349 Persistent mood [affective] disorder, unspecified: Secondary | ICD-10-CM | POA: Diagnosis not present

## 2017-04-04 ENCOUNTER — Encounter: Payer: Self-pay | Admitting: Physician Assistant

## 2017-04-04 ENCOUNTER — Ambulatory Visit: Payer: Self-pay | Admitting: Physician Assistant

## 2017-04-04 VITALS — BP 140/94 | HR 87 | Temp 98.5°F

## 2017-04-04 DIAGNOSIS — F419 Anxiety disorder, unspecified: Secondary | ICD-10-CM

## 2017-04-04 DIAGNOSIS — R002 Palpitations: Secondary | ICD-10-CM

## 2017-04-04 NOTE — Progress Notes (Signed)
S: states she was started on augmentin by dr Irving Showsjuengal, use to be allergic to pcn but has been able to take ceph. So they decided to try augmentin, hasn't had a rash or trouble breathing since starting the medication, is concerned bc her bp has been high, has some nausea and heart fluttering, pain behind neck that radiates to both arms, denies cp/sob/swelling in legs, states she is having difficulty with anxiety, her daughter and grandson moved in with her about 6 months ago so she doesn't have any time for herself and is having money problems,  Also states she has a headache behind her eyes, + nausea, did have diarrhea a few days ago, none today  O: vitals wnl, nad, tmsclear, nasal mucosa wnl, throat wnl, neck supple no lymph, lungs c t a, cv rrr, ekg  A: anxiety, ?allergic reaction to augmentin  P: stop augmentin, if feeling worse go to ER, recheck here in 2 days for bp check, call if any concerns

## 2017-04-06 ENCOUNTER — Encounter: Payer: Self-pay | Admitting: Physician Assistant

## 2017-04-06 ENCOUNTER — Ambulatory Visit: Payer: Self-pay | Admitting: Physician Assistant

## 2017-04-06 VITALS — BP 139/100 | HR 95 | Temp 98.5°F | Resp 16

## 2017-04-06 DIAGNOSIS — J0100 Acute maxillary sinusitis, unspecified: Secondary | ICD-10-CM

## 2017-04-06 DIAGNOSIS — R030 Elevated blood-pressure reading, without diagnosis of hypertension: Secondary | ICD-10-CM

## 2017-04-06 MED ORDER — AZITHROMYCIN 250 MG PO TABS: ORAL_TABLET | ORAL | 0 refills | Status: DC

## 2017-04-06 NOTE — Progress Notes (Addendum)
S: pt here for recheck of bp, states she still doesn't feel well and the sinus pressure is worse, stopped taking the augmentin, no fever/chills  O: vitals w bp 139/100, lungs c t a, cv rrr  A: sinusitis, elevated bp  P: zpack, otc cinnamon, recheck bp next wednesday

## 2017-04-11 ENCOUNTER — Encounter: Payer: Self-pay | Admitting: Physician Assistant

## 2017-04-11 ENCOUNTER — Ambulatory Visit: Payer: Self-pay | Admitting: Physician Assistant

## 2017-04-11 VITALS — BP 140/100 | HR 80 | Temp 98.4°F

## 2017-04-11 DIAGNOSIS — I10 Essential (primary) hypertension: Secondary | ICD-10-CM

## 2017-04-11 MED ORDER — LOSARTAN POTASSIUM 25 MG PO TABS: 25.0000 mg | ORAL_TABLET | Freq: Every day | ORAL | 3 refills | Status: DC

## 2017-04-11 NOTE — Progress Notes (Signed)
S: here for bp recheck, bp still elevated,   O: vitals with bp 140/100, lungs c t a, cv rrr  A: htn  P: losartan 25mg  qd, pt is to make an appt with a pcp, recheck bp here in 1 week

## 2017-04-12 DIAGNOSIS — F349 Persistent mood [affective] disorder, unspecified: Secondary | ICD-10-CM | POA: Diagnosis not present

## 2017-04-18 ENCOUNTER — Ambulatory Visit: Payer: Self-pay | Admitting: Physician Assistant

## 2017-04-19 DIAGNOSIS — F349 Persistent mood [affective] disorder, unspecified: Secondary | ICD-10-CM | POA: Diagnosis not present

## 2017-04-20 ENCOUNTER — Ambulatory Visit: Payer: Self-pay | Admitting: Physician Assistant

## 2017-04-20 ENCOUNTER — Telehealth: Payer: Self-pay | Admitting: Physician Assistant

## 2017-04-26 DIAGNOSIS — F349 Persistent mood [affective] disorder, unspecified: Secondary | ICD-10-CM | POA: Diagnosis not present

## 2017-05-03 DIAGNOSIS — F349 Persistent mood [affective] disorder, unspecified: Secondary | ICD-10-CM | POA: Diagnosis not present

## 2017-05-08 DIAGNOSIS — F349 Persistent mood [affective] disorder, unspecified: Secondary | ICD-10-CM | POA: Diagnosis not present

## 2017-05-17 DIAGNOSIS — F349 Persistent mood [affective] disorder, unspecified: Secondary | ICD-10-CM | POA: Diagnosis not present

## 2017-05-24 DIAGNOSIS — F349 Persistent mood [affective] disorder, unspecified: Secondary | ICD-10-CM | POA: Diagnosis not present

## 2017-05-31 DIAGNOSIS — F349 Persistent mood [affective] disorder, unspecified: Secondary | ICD-10-CM | POA: Diagnosis not present

## 2017-06-07 DIAGNOSIS — F349 Persistent mood [affective] disorder, unspecified: Secondary | ICD-10-CM | POA: Diagnosis not present

## 2017-06-20 ENCOUNTER — Ambulatory Visit (INDEPENDENT_AMBULATORY_CARE_PROVIDER_SITE_OTHER): Payer: Self-pay | Admitting: Nurse Practitioner

## 2017-06-20 VITALS — BP 128/82 | HR 80

## 2017-06-20 DIAGNOSIS — J0140 Acute pansinusitis, unspecified: Secondary | ICD-10-CM

## 2017-06-20 MED ORDER — PREDNISONE 20 MG PO TABS: 20.0000 mg | ORAL_TABLET | Freq: Every day | ORAL | 0 refills | Status: DC

## 2017-06-20 MED ORDER — DOXYCYCLINE HYCLATE 100 MG PO TABS: 100.0000 mg | ORAL_TABLET | Freq: Two times a day (BID) | ORAL | 0 refills | Status: DC

## 2017-06-20 NOTE — Progress Notes (Signed)
Subjective:  Michele Hammond is a 57 y.o. female who presents for evaluation of possible sinusitis.  Symptoms include fever: suspected fevers but not measured at home, nasal congestion, post nasal drip, sinus pressure, sinus pain, sneezing and sore throat.  Onset of symptoms was 5 weeks ago, and has been getting better then worse since that time.  Treatment to date:  decongestants, nasal steroids and saline nasal spray.  High risk factors for influenza complications:  history of chronic sinusitis.  The following portions of the patient's history were reviewed and updated as appropriate:  allergies, current medications and past medical history.  Constitutional: positive for chills, fatigue and fevers, negative for anorexia and sweats Eyes: negative Ears, nose, mouth, throat, and face: positive for hoarseness, sore throat and facial pain, negative for ear drainage, earaches and hoarseness Respiratory: positive for cough Cardiovascular: negative Neurological: positive for headaches Behavioral/Psych: negative Allergic/Immunologic: positive for hay fever Objective:  BP 128/82   Pulse 80   LMP 11/19/2013   SpO2 98%  General appearance: alert, cooperative and fatigued Head: Normocephalic, without obvious abnormality, atraumatic Eyes: conjunctivae/corneas clear. PERRL, EOM's intact. Fundi benign. Ears: normal TM's and external ear canals both ears Nose: yellow discharge, turbinates edematous, inflamed, moderate maxillary sinus tenderness bilateral, mild frontal sinus tenderness bilateral Throat: lips, mucosa, and tongue normal; teeth and gums normal Lungs: clear to auscultation bilaterally Heart: regular rate and rhythm, S1, S2 normal, no murmur, click, rub or gallop Pulses: 2+ and symmetric Skin: Skin color, texture, turgor normal. No rashes or lesions Lymph nodes: cervical and submandibular nodes normal Neurologic: Grossly normal    Assessment:  sinusitis    Plan:  Discussed diagnosis and  treatment of sinusitis. Suggested symptomatic OTC remedies. Supportive care with appropriate antipyretics and fluids. Nasal saline spray for congestion. Doxycycline and Prednisone per orders. Follow up as needed. Call in 5 days if symptoms aren't resolving. Continue use of Nasacort.

## 2017-06-21 DIAGNOSIS — F349 Persistent mood [affective] disorder, unspecified: Secondary | ICD-10-CM | POA: Diagnosis not present

## 2017-06-22 ENCOUNTER — Telehealth: Payer: Self-pay

## 2017-07-05 DIAGNOSIS — F349 Persistent mood [affective] disorder, unspecified: Secondary | ICD-10-CM | POA: Diagnosis not present

## 2017-07-12 DIAGNOSIS — F349 Persistent mood [affective] disorder, unspecified: Secondary | ICD-10-CM | POA: Diagnosis not present

## 2017-07-19 DIAGNOSIS — F349 Persistent mood [affective] disorder, unspecified: Secondary | ICD-10-CM | POA: Diagnosis not present

## 2017-07-26 DIAGNOSIS — F349 Persistent mood [affective] disorder, unspecified: Secondary | ICD-10-CM | POA: Diagnosis not present

## 2017-08-02 DIAGNOSIS — F349 Persistent mood [affective] disorder, unspecified: Secondary | ICD-10-CM | POA: Diagnosis not present

## 2017-08-16 DIAGNOSIS — F349 Persistent mood [affective] disorder, unspecified: Secondary | ICD-10-CM | POA: Diagnosis not present

## 2017-08-21 ENCOUNTER — Ambulatory Visit (INDEPENDENT_AMBULATORY_CARE_PROVIDER_SITE_OTHER): Payer: Self-pay | Admitting: Family Medicine

## 2017-08-21 VITALS — BP 129/61 | HR 93 | Temp 98.4°F | Wt 178.0 lb

## 2017-08-21 DIAGNOSIS — J0110 Acute frontal sinusitis, unspecified: Secondary | ICD-10-CM

## 2017-08-21 DIAGNOSIS — Z6379 Other stressful life events affecting family and household: Secondary | ICD-10-CM

## 2017-08-21 MED ORDER — DOXYCYCLINE HYCLATE 100 MG PO TABS: 100.0000 mg | ORAL_TABLET | Freq: Two times a day (BID) | ORAL | 0 refills | Status: DC

## 2017-08-21 NOTE — Progress Notes (Signed)
2 wks ago green mucus cough and fever which went away now 2days fever,cough facial pressure and cough has taken sudafed, tylenol and advil ear pressure

## 2017-08-21 NOTE — Patient Instructions (Signed)

## 2017-08-21 NOTE — Progress Notes (Signed)
Michele Hammond is a 57 y.o. female who presents today with 2 weeks of progressive symptoms of cough and congestion. Michele Hammond was most recently successfully treated for similar symptoms January 2, of this year approximately 60 days ago. She reports increased personal stress and feeling like she has been unsucessfully treating symptoms for over 2 weeks. She has a 56 year old grandson who is in day care services who has also been symptomatic and sometimes sleeps with her.   Review of Systems  Constitutional: Positive for fever. Negative for chills and malaise/fatigue.  HENT: Positive for congestion and sinus pain. Negative for sore throat.   Eyes: Negative.   Respiratory: Positive for cough. Negative for sputum production.   Cardiovascular: Negative for chest pain and palpitations.  Gastrointestinal: Negative.   Genitourinary: Negative.   Musculoskeletal: Negative for myalgias.  Psychiatric/Behavioral: Negative.      Physical Exam  Constitutional: Vital signs are normal. She appears well-developed and well-nourished. She is active.  HENT:  Head: Normocephalic.  Mouth/Throat: No oropharyngeal exudate.  Eyes: Pupils are equal, round, and reactive to light.  Neck: Normal range of motion. Neck supple.  Cardiovascular: Normal rate and regular rhythm.  Pulmonary/Chest: Effort normal. No stridor. No respiratory distress. She has no wheezes. She has no rales.  Abdominal: Soft. Bowel sounds are normal.  Neurological: She is alert.  Skin: Skin is warm and dry.   Vitals:   08/21/17 0900  BP: 129/61  Pulse: 93  Temp: 98.4 F (36.9 C)  SpO2: 96%   A: 1. Acute non-recurrent frontal sinusitis    P: Will treat based on exam and length of symptoms- discussed how family stress may affect patient health status and the importance of self care. Patient agrees with treatment plan and verbalized understanding of information provided this visit. No acute concerns on exam. 1. Acute non-recurrent frontal  sinusitis - doxycycline (VIBRA-TABS) 100 MG tablet; Take 1 tablet (100 mg total) by mouth 2 (two) times daily.  2. Stressful life events affecting family and household

## 2017-08-23 ENCOUNTER — Telehealth: Payer: Self-pay | Admitting: Emergency Medicine

## 2017-08-23 NOTE — Telephone Encounter (Signed)
Left message follow up call on visit with [facility]. 

## 2017-08-30 DIAGNOSIS — F349 Persistent mood [affective] disorder, unspecified: Secondary | ICD-10-CM | POA: Diagnosis not present

## 2017-09-06 DIAGNOSIS — F349 Persistent mood [affective] disorder, unspecified: Secondary | ICD-10-CM | POA: Diagnosis not present

## 2017-09-13 DIAGNOSIS — F349 Persistent mood [affective] disorder, unspecified: Secondary | ICD-10-CM | POA: Diagnosis not present

## 2017-09-20 DIAGNOSIS — F349 Persistent mood [affective] disorder, unspecified: Secondary | ICD-10-CM | POA: Diagnosis not present

## 2017-09-27 DIAGNOSIS — F349 Persistent mood [affective] disorder, unspecified: Secondary | ICD-10-CM | POA: Diagnosis not present

## 2017-10-18 DIAGNOSIS — F349 Persistent mood [affective] disorder, unspecified: Secondary | ICD-10-CM | POA: Diagnosis not present

## 2017-10-25 DIAGNOSIS — F349 Persistent mood [affective] disorder, unspecified: Secondary | ICD-10-CM | POA: Diagnosis not present

## 2017-11-01 DIAGNOSIS — F349 Persistent mood [affective] disorder, unspecified: Secondary | ICD-10-CM | POA: Diagnosis not present

## 2017-11-08 DIAGNOSIS — F349 Persistent mood [affective] disorder, unspecified: Secondary | ICD-10-CM | POA: Diagnosis not present

## 2017-11-09 ENCOUNTER — Telehealth: Payer: Self-pay

## 2017-11-09 NOTE — Telephone Encounter (Signed)
Copied from CRM [id number]. Topic: Appointment Scheduling - Scheduling Inquiry for Clinic >> Nov 09, 2017  2:54 PM Lorrine Kin, [place] wrote: Reason for CRM: Patient calling and states that she needs a physical. Last seen in the office 01/20/15 by Naomie Dean. Offered her 01/03/18 with Dr Shirlee Latch for a [facility] visit and she states that she needs to be seen for a physical before July 1st due to insurance purposed. Please advise. CB#: [phone] (c)   [phone] (w)

## 2017-11-13 NOTE — Telephone Encounter (Signed)
The patient has been scheduled. She will have a physical with her GYN and transfer care to Dr. Shirlee Latch.

## 2017-11-15 ENCOUNTER — Ambulatory Visit: Payer: 59 | Admitting: Internal Medicine

## 2017-11-15 ENCOUNTER — Encounter: Payer: Self-pay | Admitting: Internal Medicine

## 2017-11-15 VITALS — BP 116/82 | HR 72 | Temp 98.2°F | Ht 63.0 in | Wt 183.6 lb

## 2017-11-15 DIAGNOSIS — Z1283 Encounter for screening for malignant neoplasm of skin: Secondary | ICD-10-CM

## 2017-11-15 DIAGNOSIS — E559 Vitamin D deficiency, unspecified: Secondary | ICD-10-CM | POA: Diagnosis not present

## 2017-11-15 DIAGNOSIS — Z1231 Encounter for screening mammogram for malignant neoplasm of breast: Secondary | ICD-10-CM

## 2017-11-15 DIAGNOSIS — I10 Essential (primary) hypertension: Secondary | ICD-10-CM | POA: Insufficient documentation

## 2017-11-15 DIAGNOSIS — Z113 Encounter for screening for infections with a predominantly sexual mode of transmission: Secondary | ICD-10-CM

## 2017-11-15 DIAGNOSIS — Z23 Encounter for immunization: Secondary | ICD-10-CM | POA: Diagnosis not present

## 2017-11-15 DIAGNOSIS — R739 Hyperglycemia, unspecified: Secondary | ICD-10-CM

## 2017-11-15 DIAGNOSIS — Z13220 Encounter for screening for lipoid disorders: Secondary | ICD-10-CM | POA: Diagnosis not present

## 2017-11-15 DIAGNOSIS — L568 Other specified acute skin changes due to ultraviolet radiation: Secondary | ICD-10-CM | POA: Diagnosis not present

## 2017-11-15 DIAGNOSIS — F349 Persistent mood [affective] disorder, unspecified: Secondary | ICD-10-CM | POA: Diagnosis not present

## 2017-11-15 DIAGNOSIS — Z0184 Encounter for antibody response examination: Secondary | ICD-10-CM | POA: Diagnosis not present

## 2017-11-15 DIAGNOSIS — Z114 Encounter for screening for human immunodeficiency virus [HIV]: Secondary | ICD-10-CM

## 2017-11-15 DIAGNOSIS — Z8619 Personal history of other infectious and parasitic diseases: Secondary | ICD-10-CM | POA: Diagnosis not present

## 2017-11-15 DIAGNOSIS — Z1159 Encounter for screening for other viral diseases: Secondary | ICD-10-CM | POA: Diagnosis not present

## 2017-11-15 DIAGNOSIS — R21 Rash and other nonspecific skin eruption: Secondary | ICD-10-CM

## 2017-11-15 DIAGNOSIS — Z1329 Encounter for screening for other suspected endocrine disorder: Secondary | ICD-10-CM

## 2017-11-15 DIAGNOSIS — W57XXXA Bitten or stung by nonvenomous insect and other nonvenomous arthropods, initial encounter: Secondary | ICD-10-CM | POA: Diagnosis not present

## 2017-11-15 DIAGNOSIS — K760 Fatty (change of) liver, not elsewhere classified: Secondary | ICD-10-CM | POA: Insufficient documentation

## 2017-11-15 DIAGNOSIS — Z13818 Encounter for screening for other digestive system disorders: Secondary | ICD-10-CM

## 2017-11-15 DIAGNOSIS — B182 Chronic viral hepatitis C: Secondary | ICD-10-CM

## 2017-11-15 DIAGNOSIS — Z1389 Encounter for screening for other disorder: Secondary | ICD-10-CM | POA: Diagnosis not present

## 2017-11-15 LAB — COMPREHENSIVE METABOLIC PANEL
ALBUMIN: 4.5 g/dL (ref 3.5–5.2)
ALK PHOS: 50 U/L (ref 39–117)
ALT: 15 U/L (ref 0–35)
AST: 16 U/L (ref 0–37)
BILIRUBIN TOTAL: 0.4 mg/dL (ref 0.2–1.2)
BUN: 11 mg/dL (ref 6–23)
CALCIUM: 9.6 mg/dL (ref 8.4–10.5)
CO2: 29 mEq/L (ref 19–32)
CREATININE: 0.67 mg/dL (ref 0.40–1.20)
Chloride: 102 mEq/L (ref 96–112)
GFR: 96.93 mL/min (ref >60.00)
Glucose, Bld: 95 mg/dL (ref 70–99)
Potassium: 4 mEq/L (ref 3.5–5.1)
Sodium: 139 mEq/L (ref 135–145)
TOTAL PROTEIN: 7.4 g/dL (ref 6.0–8.3)

## 2017-11-15 LAB — CBC WITH DIFFERENTIAL/PLATELET
BASOS ABS: 0 10*3/uL (ref 0.0–0.1)
Basophils Relative: 0.7 % (ref 0.0–3.0)
EOS ABS: 0.1 10*3/uL (ref 0.0–0.7)
Eosinophils Relative: 2 % (ref 0.0–5.0)
HEMATOCRIT: 38 % (ref 36.0–46.0)
Hemoglobin: 12.9 g/dL (ref 12.0–15.0)
LYMPHS PCT: 34.1 % (ref 12.0–46.0)
Lymphs Abs: 1.9 10*3/uL (ref 0.7–4.0)
MCHC: 33.8 g/dL (ref 30.0–36.0)
MCV: 88.7 fl (ref 78.0–100.0)
Monocytes Absolute: 0.3 10*3/uL (ref 0.1–1.0)
Monocytes Relative: 6.1 % (ref 3.0–12.0)
NEUTROS PCT: 57.1 % (ref 43.0–77.0)
Neutro Abs: 3.2 10*3/uL (ref 1.4–7.7)
Platelets: 239 10*3/uL (ref 150.0–400.0)
RBC: 4.29 Mil/uL (ref 3.87–5.11)
RDW: 13.7 % (ref 11.5–15.5)
WBC: 5.7 10*3/uL (ref 4.0–10.5)

## 2017-11-15 LAB — LIPID PANEL
CHOLESTEROL: 263 mg/dL — AB (ref 0–200)
HDL: 57.3 mg/dL (ref >39.00)
LDL Cholesterol: 172 mg/dL — ABNORMAL HIGH (ref 0–99)
NONHDL: 205.22
Total CHOL/HDL Ratio: 5
Triglycerides: 164 mg/dL — ABNORMAL HIGH (ref 0.0–149.0)
VLDL: 32.8 mg/dL (ref 0.0–40.0)

## 2017-11-15 LAB — VITAMIN D 25 HYDROXY (VIT D DEFICIENCY, FRACTURES): VITD: 18.72 ng/mL — AB (ref 30.00–100.00)

## 2017-11-15 LAB — TSH: TSH: 6.54 u[IU]/mL — ABNORMAL HIGH (ref 0.35–4.50)

## 2017-11-15 LAB — T4, FREE: FREE T4: 0.71 ng/dL (ref 0.60–1.60)

## 2017-11-15 LAB — HEMOGLOBIN A1C: HEMOGLOBIN A1C: 5.6 % (ref 4.6–6.5)

## 2017-11-15 MED ORDER — HYDROCORTISONE 2.5 % EX CREA: TOPICAL_CREAM | Freq: Two times a day (BID) | CUTANEOUS | 0 refills | Status: DC

## 2017-11-15 MED ORDER — CLOTRIMAZOLE 1 % EX CREA: 1.0000 "application " | TOPICAL_CREAM | Freq: Two times a day (BID) | CUTANEOUS | 0 refills | Status: DC

## 2017-11-15 NOTE — Patient Instructions (Addendum)
F/u in 4-6 weeks sooner if needed   Seborrheic Keratosis Seborrheic keratosis is a common, noncancerous (benign) skin growth. This condition causes waxy, rough, tan, brown, or black spots to appear on the skin. These skin growths can be flat or raised. What are the causes? The cause of this condition is not known. What increases the risk? This condition is more likely to develop in:  People who have a family history of seborrheic keratosis.  People who are 62 or older.  People who are pregnant.  People who have had estrogen replacement therapy.  What are the signs or symptoms? This condition often occurs on the face, chest, shoulders, back, or other areas. These growths:  Are usually painless, but may become irritated and itchy.  Can be yellow, brown, black, or other colors.  Are slightly raised or have a flat surface.  Are sometimes rough or wart-like in texture.  Are often waxy on the surface.  Are round or oval-shaped.  Sometimes look like they are "stuck on."  Often occur in groups, but may occur as a single growth.  How is this diagnosed? This condition is diagnosed with a medical history and physical exam. A sample of the growth may be tested (skin biopsy). You may need to see a skin specialist (dermatologist). How is this treated? Treatment is not usually needed for this condition, unless the growths are irritated or are often bleeding. You may also choose to have the growths removed if you do not like their appearance. Most commonly, these growths are treated with a procedure in which liquid nitrogen is applied to "freeze" off the growth (cryosurgery). They may also be burned off with electricity or cut off. Follow these instructions at home:  Watch your growth for any changes.  Keep all follow-up visits as told by your health care provider. This is important.  Do not scratch or pick at the growth or growths. This can cause them to become irritated or  infected. Contact a health care provider if:  You suddenly have many new growths.  Your growth bleeds, itches, or hurts.  Your growth suddenly becomes larger or changes color. This information is not intended to replace advice given to you by your health care provider. Make sure you discuss any questions you have with your health care provider. Document Released: 07/08/2010 Document Revised: 11/11/2015 Document Reviewed: 10/21/2014 Elsevier Interactive Patient Education  2018 [employer]  Plantar Fasciitis Plantar fasciitis is a painful foot condition that affects the heel. It occurs when the band of tissue that connects the toes to the heel bone (plantar fascia) becomes irritated. This can happen after exercising too much or doing other repetitive activities (overuse injury). The pain from plantar fasciitis can range from mild irritation to severe pain that makes it difficult for you to walk or move. The pain is usually worse in the morning or after you have been sitting or lying down for a while. What are the causes? This condition may be caused by:  Standing for long periods of time.  Wearing shoes that do not fit.  Doing high-impact activities, including running, aerobics, and ballet.  Being overweight.  Having an abnormal way of walking (gait).  Having tight calf muscles.  Having high arches in your feet.  Starting a new athletic activity.  What are the signs or symptoms? The main symptom of this condition is heel pain. Other symptoms include:  Pain that gets worse after activity or exercise.  Pain that is worse in  the morning or after resting.  Pain that goes away after you walk for a few minutes.  How is this diagnosed? This condition may be diagnosed based on your signs and symptoms. Your health care provider will also do a physical exam to check for:  A tender area on the bottom of your foot.  A high arch in your foot.  Pain when you move your  foot.  Difficulty moving your foot.  You may also need to have imaging studies to confirm the diagnosis. These can include:  X-rays.  Ultrasound.  MRI.  How is this treated? Treatment for plantar fasciitis depends on the severity of the condition. Your treatment may include:  Rest, ice, and over-the-counter pain medicines to manage your pain.  Exercises to stretch your calves and your plantar fascia.  A splint that holds your foot in a stretched, upward position while you sleep (night splint).  Physical therapy to relieve symptoms and prevent problems in the future.  Cortisone injections to relieve severe pain.  Extracorporeal shock wave therapy (ESWT) to stimulate damaged plantar fascia with electrical impulses. It is often used as a last resort before surgery.  Surgery, if other treatments have not worked after 12 months.  Follow these instructions at home:  Take medicines only as directed by your health care provider.  Avoid activities that cause pain.  Roll the bottom of your foot over a bag of ice or a bottle of cold water. Do this for 20 minutes, 3-4 times a day.  Perform simple stretches as directed by your health care provider.  Try wearing athletic shoes with air-sole or gel-sole cushions or soft shoe inserts.  Wear a night splint while sleeping, if directed by your health care provider.  Keep all follow-up appointments with your health care provider. How is this prevented?  Do not perform exercises or activities that cause heel pain.  Consider finding low-impact activities if you continue to have problems.  Lose weight if you need to. The best way to prevent plantar fasciitis is to avoid the activities that aggravate your plantar fascia. Contact a health care provider if:  Your symptoms do not go away after treatment with home care measures.  Your pain gets worse.  Your pain affects your ability to move or do your daily activities. This information  is not intended to replace advice given to you by your health care provider. Make sure you discuss any questions you have with your health care provider. Document Released: 02/28/2001 Document Revised: 11/08/2015 Document Reviewed: 04/15/2014 Elsevier Interactive Patient Education  [employer].

## 2017-11-15 NOTE — Progress Notes (Signed)
Pre visit review using our clinic review tool, if applicable. No additional management support is needed unless otherwise documented below in the visit note. 

## 2017-11-15 NOTE — Progress Notes (Signed)
Chief Complaint  Patient presents with  . Follow-up   Follow up transfer of care  1. HTN controlled on losartan 25 mg qd  2. Right leg lesion since Sat/Sunday 1.25 inches scaly no itching tried essential oils on it not helping  3. C/o photosenstivity of skin in the sun skin feels burning and prickly also c/o pink rash to face  4. Needs EKG today psych is going to put on medication for binge eating and needs initial EKG prior to start   Review of Systems  Constitutional: Negative for weight loss.  HENT: Negative for hearing loss.   Eyes: Negative for blurred vision.  Respiratory: Negative for shortness of breath.   Cardiovascular: Negative for chest pain.  Gastrointestinal: Negative for abdominal pain.  Musculoskeletal:       +right heel pain   Skin: Positive for rash.  Neurological: Negative for headaches.  Psychiatric/Behavioral: Negative for depression.   Past Medical History:  Diagnosis Date  . Allergy   . Depression    Followed By Dr. Sallyanne Kuster [facility]  . Dysrhythmia   . Frequent headaches   . Heart murmur   . Hepatitis    C-pt took treatment 2015 and states does not have Hep C now (02-10-15)  . Herpes genitalis in women    Past Surgical History:  Procedure Laterality Date  . CESAREAN SECTION  1990, 1996  . CHOLECYSTECTOMY N/A 02/16/2015   Procedure: LAPAROSCOPIC CHOLECYSTECTOMY;  Surgeon: Ida Rogue, MD;  Location: [facility] ORS;  Service: General;  Laterality: N/A;  . SEPTOPLASTY  1993   Deviated Septum  . WRIST FRACTURE SURGERY     Family History  Problem Relation Age of Onset  . Cancer Mother        uterine cancer  . Depression Mother   . Hyperlipidemia Father   . Hypertension Father   . Diabetes Father   . Hypertension Sister   . Depression Sister   . Hypertension Sister   . Hypertension Brother    Social History   Socioeconomic History  . Marital status: Single    Spouse name: Not on file  . Number of children: 3  . Years of education:  28  . Highest education level: Not on file  Occupational History  . Occupation: Animator: [employer]    Comment: [employer]  Social Needs  . Financial resource strain: Not on file  . Food insecurity:    Worry: Not on file    Inability: Not on file  . Transportation needs:    Medical: Not on file    Non-medical: Not on file  Tobacco Use  . Smoking status: Never Smoker  . Smokeless tobacco: Never Used  Substance and Sexual Activity  . Alcohol use: Yes    Comment: 1 glass of wine weekly  . Drug use: No  . Sexual activity: Not on file  Lifestyle  . Physical activity:    Days per week: Not on file    Minutes per session: Not on file  . Stress: Not on file  Relationships  . Social connections:    Talks on phone: Not on file    Gets together: Not on file    Attends religious service: Not on file    Active member of club or organization: Not on file    Attends meetings of clubs or organizations: Not on file    Relationship status: Not on file  . Intimate partner violence:  Fear of current or ex partner: Not on file    Emotionally abused: Not on file    Physically abused: Not on file    Forced sexual activity: Not on file  Other Topics Concern  . Not on file  Social History Narrative   Kenslei grew up in [place], [place]. She is currently living in [place], [place]. She is living alone. 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 Oza enjoys riding her horses on her spare time. She has two horses and a pony.   Current Meds  Medication Sig  . buPROPion (WELLBUTRIN XL) 150 MG 24 hr tablet Take 450 mg by mouth every morning.   Marland Kitchen losartan (COZAAR) 25 MG tablet Take 1 tablet (25 mg total) by mouth daily.  . Multiple Vitamin (MULTIVITAMIN) capsule Take 1 capsule by mouth daily.  . pseudoephedrine (SUDAFED) 30 MG tablet   . sertraline (ZOLOFT) 25 MG tablet   . triamcinolone (NASACORT ALLERGY 24HR) 55 MCG/ACT AERO nasal inhaler Place 2 sprays into the  nose daily.  Marland Kitchen zolpidem (AMBIEN) 10 MG tablet Take 10 mg by mouth at bedtime as needed for sleep.   Allergies  Allergen Reactions  . Amoxicillin Rash  . Penicillin G Rash  . Sulfa Antibiotics Rash   No results found for this or any previous visit (from the past 2160 hour(s)). Objective  Body mass index is 32.52 kg/m. Wt Readings from Last 3 Encounters:  11/15/17 183 lb 9.6 oz (83.3 kg)  08/21/17 178 lb (80.7 kg)  04/03/16 180 lb (81.6 kg)   Temp Readings from Last 3 Encounters:  11/15/17 98.2 F (36.8 C) (Oral)  08/21/17 98.4 F (36.9 C)  04/11/17 98.4 F (36.9 C)   BP Readings from Last 3 Encounters:  11/15/17 116/82  08/21/17 129/61  06/20/17 128/82   Pulse Readings from Last 3 Encounters:  11/15/17 72  08/21/17 93  06/20/17 80    Physical Exam  Constitutional: She is oriented to person, place, and time. Vital signs are normal. She appears well-developed and well-nourished. She is cooperative.  HENT:  Head: Normocephalic and atraumatic.  Mouth/Throat: Oropharynx is clear and moist and mucous membranes are normal.  Eyes: Pupils are equal, round, and reactive to light. Conjunctivae are normal.  Cardiovascular: Normal rate, regular rhythm and normal heart sounds.  Pulmonary/Chest: Effort normal and breath sounds normal. Right breast exhibits no inverted nipple, no mass, no nipple discharge, no skin change and no tenderness. Left breast exhibits no inverted nipple, no mass, no nipple discharge, no skin change and no tenderness. No breast swelling, tenderness, discharge or bleeding. Breasts are symmetrical.  Neurological: She is alert and oriented to person, place, and time. Gait normal.  Skin: Skin is warm and dry. Lesion noted. There is erythema.     1-1.25 inch red lesion right lower leg red and scaly   Psychiatric: She has a normal mood and affect. Her speech is normal and behavior is normal. Judgment and thought content normal. Cognition and memory are normal.    Nursing note and vitals reviewed.   Assessment   1. HTN controlled EKG NSR low voltage  2. Right lower leg lesion ? Etiology fungal vs other  3. Photosensitivity  4. HM Plan   1. Cont losartan 25 mg qd  2. Clotrimazole and hc Refer to derm to check and tbse  3. Check labs today  4.  Unknown if had flu shot  Tdap today  Disc shingrix in future   Pap  had 07/25/13 neg pap neg HPV upcoming ob/gyn appt  Refer mammo  Colonoscopy had [facility] 07/07/09 need to get records  Refer dermatology today   Provider: Dr. French Ana McLean-[facility]oc[facility]

## 2017-11-16 LAB — URINALYSIS, ROUTINE W REFLEX MICROSCOPIC
Bilirubin, UA: NEGATIVE
GLUCOSE, UA: NEGATIVE
KETONES UA: NEGATIVE
Leukocytes, UA: NEGATIVE
NITRITE UA: NEGATIVE
Protein, UA: NEGATIVE
RBC, UA: NEGATIVE
SPEC GRAV UA: 1.007 (ref 1.005–1.030)
UUROB: 0.2 mg/dL (ref 0.2–1.0)
pH, UA: 6.5 (ref 5.0–7.5)

## 2017-11-16 LAB — HIV ANTIBODY (ROUTINE TESTING W REFLEX): HIV: NONREACTIVE

## 2017-11-19 LAB — MEASLES/MUMPS/RUBELLA IMMUNITY
Mumps IgG: 44.9 AU/mL
Rubella: 3.88 index
Rubeola IgG: <25 AU/mL — ABNORMAL LOW

## 2017-11-19 LAB — HEPATITIS B SURFACE ANTIBODY, QUANTITATIVE: HEPATITIS B-POST: 76 m[IU]/mL (ref >=10)

## 2017-11-19 LAB — HEPATITIS C ANTIBODY
Hepatitis C Ab: REACTIVE — AB
SIGNAL TO CUT-OFF: 21 — ABNORMAL HIGH (ref <1.00)

## 2017-11-19 LAB — HCV RNA,QUANTITATIVE REAL TIME PCR
HCV QUANT LOG: NOT DETECTED {Log_IU}/mL
HCV RNA, PCR, QN: NOT DETECTED [IU]/mL

## 2017-11-19 LAB — ANA: ANA: NEGATIVE

## 2017-11-19 LAB — B. BURGDORFI ANTIBODIES: B burgdorferi Ab IgG+IgM: <0.9 index

## 2017-11-19 LAB — HEPATITIS B SURFACE ANTIGEN: HEP B S AG: NONREACTIVE

## 2017-11-20 ENCOUNTER — Other Ambulatory Visit: Payer: Self-pay | Admitting: Internal Medicine

## 2017-11-20 DIAGNOSIS — E559 Vitamin D deficiency, unspecified: Secondary | ICD-10-CM

## 2017-11-20 MED ORDER — CHOLECALCIFEROL 1.25 MG (50000 UT) PO CAPS: 50000.0000 [IU] | ORAL_CAPSULE | ORAL | 1 refills | Status: DC

## 2017-11-22 ENCOUNTER — Encounter: Payer: 59 | Admitting: Certified Nurse Midwife

## 2017-11-22 DIAGNOSIS — F349 Persistent mood [affective] disorder, unspecified: Secondary | ICD-10-CM | POA: Diagnosis not present

## 2017-11-23 ENCOUNTER — Encounter: Payer: Self-pay | Admitting: Internal Medicine

## 2017-11-29 ENCOUNTER — Encounter: Payer: Self-pay | Admitting: Internal Medicine

## 2017-11-29 DIAGNOSIS — F349 Persistent mood [affective] disorder, unspecified: Secondary | ICD-10-CM | POA: Diagnosis not present

## 2017-11-29 NOTE — Progress Notes (Signed)
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 Rodney Cruiseendler [facility]  Colon bx negative rec f/u in 10 years   TMS

## 2017-12-10 ENCOUNTER — Ambulatory Visit (INDEPENDENT_AMBULATORY_CARE_PROVIDER_SITE_OTHER): Payer: 59 | Admitting: Internal Medicine

## 2017-12-10 ENCOUNTER — Encounter: Payer: Self-pay | Admitting: Internal Medicine

## 2017-12-10 ENCOUNTER — Other Ambulatory Visit (HOSPITAL_COMMUNITY)
Admission: RE | Admit: 2017-12-10 | Discharge: 2017-12-10 | Disposition: A | Discharge status: Home / Self Care | Payer: 59 | Source: Ambulatory Visit | Attending: Internal Medicine | Admitting: Internal Medicine

## 2017-12-10 VITALS — BP 110/78 | HR 72 | Temp 98.0°F | Ht 63.0 in | Wt 176.0 lb

## 2017-12-10 DIAGNOSIS — Z124 Encounter for screening for malignant neoplasm of cervix: Secondary | ICD-10-CM

## 2017-12-10 DIAGNOSIS — E785 Hyperlipidemia, unspecified: Secondary | ICD-10-CM

## 2017-12-10 DIAGNOSIS — R197 Diarrhea, unspecified: Secondary | ICD-10-CM | POA: Diagnosis not present

## 2017-12-10 DIAGNOSIS — I10 Essential (primary) hypertension: Secondary | ICD-10-CM

## 2017-12-10 DIAGNOSIS — Z0001 Encounter for general adult medical examination with abnormal findings: Secondary | ICD-10-CM | POA: Insufficient documentation

## 2017-12-10 DIAGNOSIS — Z0000 Encounter for general adult medical examination without abnormal findings: Secondary | ICD-10-CM | POA: Insufficient documentation

## 2017-12-10 DIAGNOSIS — R7989 Other specified abnormal findings of blood chemistry: Secondary | ICD-10-CM | POA: Diagnosis not present

## 2017-12-10 LAB — T4, FREE: FREE T4: 0.75 ng/dL (ref 0.60–1.60)

## 2017-12-10 LAB — TSH: TSH: 3.54 u[IU]/mL (ref 0.35–4.50)

## 2017-12-10 LAB — T3, FREE: T3 FREE: 2.9 pg/mL (ref 2.3–4.2)

## 2017-12-10 NOTE — Progress Notes (Signed)
Pre visit review using our clinic review tool, if applicable. No additional management support is needed unless otherwise documented below in the visit note. 

## 2017-12-10 NOTE — Patient Instructions (Addendum)
Please sch dermatology appt  rec MMR vaccine F/u in 4 months    Results for Michele Hammond, Michele Hammond (MRN [id number]) as of 12/10/2017 09:52  Ref. Range 11/15/2017 09:36  Rubella Latest Units: index 3.88  Hepatitis B Surface Ag Latest Ref Range: NON-REACTI  NON-REACTIVE  Hepatitis B-Post Latest Ref Range: > OR = 10 mIU/mL 76  Hepatitis C Ab Latest Ref Range: NON-REACTI  REACTIVE (A)  HCV Quantitative Log Latest Ref Range: NOT DETECT Log IU/mL <1.18 NOT DETECTED  HCV RNA, PCR, QN Latest Ref Range: NOT DETECT IU/mL <15 NOT DETECTED  SIGNAL TO CUT-OFF Latest Ref Range: <1.00  21.00 (H)  Mumps IgG Latest Units: AU/mL 44.90  Rubeola IgG Latest Units: AU/mL <25.00 (L)    Ezetimibe Tablets What is this medicine? EZETIMIBE (ez ET i mibe) blocks the absorption of cholesterol from the stomach. It can help lower blood cholesterol for patients who are at risk of getting heart disease or a stroke. It is only for patients whose cholesterol level is not controlled by diet. This medicine may be used for other purposes; ask your health care provider or pharmacist if you have questions. COMMON BRAND NAME(S): Zetia What should I tell my health care provider before I take this medicine? They need to know if you have any of these conditions: -liver disease -an unusual or allergic reaction to ezetimibe, medicines, foods, dyes, or preservatives -pregnant or trying to get pregnant -breast-feeding How should I use this medicine? Take this medicine by mouth with a glass of water. Follow the directions on the prescription label. This medicine can be taken with or without food. Take your doses at regular intervals. Do not take your medicine more often than directed. Talk to your pediatrician regarding the use of this medicine in children. Special care may be needed. Overdosage: If you think you have taken too much of this medicine contact a poison control center or emergency room at once. NOTE: This medicine is only for you.  Do not share this medicine with others. What if I miss a dose? If you miss a dose, take it as soon as you can. If it is almost time for your next dose, take only that dose. Do not take double or extra doses. What may interact with this medicine? Do not take this medicine with any of the following medications: -fenofibrate -gemfibrozil This medicine may also interact with the following medications: -antacids -cyclosporine -herbal medicines like red yeast rice -other medicines to lower cholesterol or triglycerides This list may not describe all possible interactions. Give your health care provider a list of all the medicines, herbs, non-prescription drugs, or dietary supplements you use. Also tell them if you smoke, drink alcohol, or use illegal drugs. Some items may interact with your medicine. What should I watch for while using this medicine? Visit your doctor or health care professional for regular checks on your progress. You will need to have your cholesterol levels checked. If you are also taking some other cholesterol medicines, you will also need to have tests to make sure your liver is working properly. Tell your doctor or health care professional if you get any unexplained muscle pain, tenderness, or weakness, especially if you also have a fever and tiredness. You need to follow a low-cholesterol, low-fat diet while you are taking this medicine. This will decrease your risk of getting heart and blood vessel disease. Exercising and avoiding alcohol and smoking can also help. Ask your doctor or dietician for advice. What side  effects may I notice from receiving this medicine? Side effects that you should report to your doctor or health care professional as soon as possible: -allergic reactions like skin rash, itching or hives, swelling of the face, lips, or tongue -dark yellow or brown urine -unusually weak or tired -yellowing of the skin or eyes Side effects that usually do not require  medical attention (report to your doctor or health care professional if they continue or are bothersome): -diarrhea -dizziness -headache -stomach upset or pain This list may not describe all possible side effects. Call your doctor for medical advice about side effects. You may report side effects to FDA at 1-800-FDA-1088. Where should I keep my medicine? Keep out of the reach of children. Store at room temperature between 15 and 30 degrees C (59 and 86 degrees F). Protect from moisture. Keep container tightly closed. Throw away any unused medicine after the expiration date. NOTE: This sheet is a summary. It may not cover all possible information. If you have questions about this medicine, talk to your doctor, pharmacist, or health care provider.  2018 Elsevier/Gold Standard (2011-12-11 15:39:09)  Cholesterol Cholesterol is a white, waxy, fat-like substance that is needed by the human body in small amounts. The liver makes all the cholesterol we need. Cholesterol is carried from the liver by the blood through the blood vessels. Deposits of cholesterol (plaques) may build up on blood vessel (artery) walls. Plaques make the arteries narrower and stiffer. Cholesterol plaques increase the risk for heart attack and stroke. You cannot feel your cholesterol level even if it is very high. The only way to know that it is high is to have a blood test. Once you know your cholesterol levels, you should keep a record of the test results. Work with your health care provider to keep your levels in the desired range. What do the results mean?  Total cholesterol is a rough measure of all the cholesterol in your blood.  LDL (low-density lipoprotein) is the "bad" cholesterol. This is the type that causes plaque to build up on the artery walls. You want this level to be low.  HDL (high-density lipoprotein) is the "good" cholesterol because it cleans the arteries and carries the LDL away. You want this level to be  high.  Triglycerides are fat that the body can either burn for energy or store. High levels are closely linked to heart disease. What are the desired levels of cholesterol?  Total cholesterol below 200.  LDL below 100 for people who are at risk, below 70 for people at very high risk.  HDL above 40 is good. A level of 60 or higher is considered to be protective against heart disease.  Triglycerides below 150. How can I lower my cholesterol? Diet Follow your diet program as told by your health care provider.  Choose fish or white meat chicken and [place], roasted or baked. Limit fatty cuts of red meat, fried foods, and processed meats, such as sausage and lunch meats.  Eat lots of fresh fruits and vegetables.  Choose whole grains, beans, pasta, potatoes, and cereals.  Choose olive oil, corn oil, or canola oil, and use only small amounts.  Avoid butter, mayonnaise, shortening, or palm kernel oils.  Avoid foods with trans fats.  Drink skim or nonfat milk and eat low-fat or nonfat yogurt and cheeses. Avoid whole milk, cream, ice cream, egg yolks, and full-fat cheeses.  Healthier desserts include angel food cake, ginger snaps, animal crackers, hard candy, popsicles, and  low-fat or nonfat frozen yogurt. Avoid pastries, cakes, pies, and cookies.  Exercise  Follow your exercise program as told by your health care provider. A regular program: ? Helps to decrease LDL and raise HDL. ? Helps with weight control.  Do things that increase your activity level, such as gardening, walking, and taking the stairs.  Ask your health care provider about ways that you can be more active in your daily life.  Medicine  Take over-the-counter and prescription medicines only as told by your health care provider. ? Medicine may be prescribed by your health care provider to help lower cholesterol and decrease the risk for heart disease. This is usually done if diet and exercise have failed to bring down  cholesterol levels. ? If you have several risk factors, you may need medicine even if your levels are normal.  This information is not intended to replace advice given to you by your health care provider. Make sure you discuss any questions you have with your health care provider. Document Released: 02/28/2001 Document Revised: 01/01/2016 Document Reviewed: 12/04/2015 Elsevier Interactive Patient Education  2018 [employer].  Diarrhea, Adult Diarrhea is frequent loose and watery bowel movements. Diarrhea can make you feel weak and cause you to become dehydrated. Dehydration can make you tired and thirsty, cause you to have a dry mouth, and decrease how often you urinate. Diarrhea typically lasts 2-3 days. However, it can last longer if it is a sign of something more serious. It is important to treat your diarrhea as told by your health care provider. Follow these instructions at home: Eating and drinking  Follow these recommendations as told by your health care provider:  Take an oral rehydration solution (ORS). This is a drink that is sold at pharmacies and retail stores.  Drink clear fluids, such as water, ice chips, diluted fruit juice, and low-calorie sports drinks.  Eat bland, easy-to-digest foods in small amounts as you are able. These foods include bananas, applesauce, rice, lean meats, toast, and crackers.  Avoid drinking fluids that contain a lot of sugar or caffeine, such as energy drinks, sports drinks, and soda.  Avoid alcohol.  Avoid spicy or fatty foods.  General instructions  Drink enough fluid to keep your urine clear or pale yellow.  Wash your hands often. If soap and water are not available, use hand sanitizer.  Make sure that all people in your household wash their hands well and often.  Take over-the-counter and prescription medicines only as told by your health care provider.  Rest at home while you recover.  Watch your condition for any changes.  Take a  warm bath to relieve any burning or pain from frequent diarrhea episodes.  Keep all follow-up visits as told by your health care provider. This is important. Contact a health care provider if:  You have a fever.  Your diarrhea gets worse.  You have new symptoms.  You cannot keep fluids down.  You feel light-headed or dizzy.  You have a headache  You have muscle cramps. Get help right away if:  You have chest pain.  You feel extremely weak or you faint.  You have bloody or black stools or stools that look like tar.  You have severe pain, cramping, or bloating in your abdomen.  You have trouble breathing or you are breathing very quickly.  Your heart is beating very quickly.  Your skin feels cold and clammy.  You feel confused.  You have signs of dehydration, such as: ?  Dark urine, very little urine, or no urine. ? Cracked lips. ? Dry mouth. ? Sunken eyes. ? Sleepiness. ? Weakness. This information is not intended to replace advice given to you by your health care provider. Make sure you discuss any questions you have with your health care provider. Document Released: 05/26/2002 Document Revised: 10/14/2015 Document Reviewed: 02/09/2015 Elsevier Interactive Patient Education  [employer].

## 2017-12-11 ENCOUNTER — Other Ambulatory Visit: Payer: Self-pay

## 2017-12-11 ENCOUNTER — Other Ambulatory Visit
Admission: RE | Admit: 2017-12-11 | Discharge: 2017-12-11 | Disposition: A | Discharge status: Home / Self Care | Payer: 59 | Source: Ambulatory Visit | Attending: Internal Medicine | Admitting: Internal Medicine

## 2017-12-11 DIAGNOSIS — R197 Diarrhea, unspecified: Secondary | ICD-10-CM

## 2017-12-11 LAB — GASTROINTESTINAL PANEL BY PCR, STOOL (REPLACES STOOL CULTURE)
ADENOVIRUS F40/41: NOT DETECTED
ASTROVIRUS: NOT DETECTED
Campylobacter species: NOT DETECTED
Cryptosporidium: NOT DETECTED
Cyclospora cayetanensis: NOT DETECTED
ENTAMOEBA HISTOLYTICA: NOT DETECTED
ENTEROPATHOGENIC E COLI (EPEC): NOT DETECTED
ENTEROTOXIGENIC E COLI (ETEC): NOT DETECTED
Enteroaggregative E coli (EAEC): NOT DETECTED
Giardia lamblia: NOT DETECTED
Norovirus GI/GII: NOT DETECTED
Plesimonas shigelloides: NOT DETECTED
Rotavirus A: NOT DETECTED
SHIGA LIKE TOXIN PRODUCING E COLI (STEC): NOT DETECTED
Salmonella species: NOT DETECTED
Sapovirus (I, II, IV, and V): NOT DETECTED
Shigella/Enteroinvasive E coli (EIEC): NOT DETECTED
VIBRIO CHOLERAE: NOT DETECTED
VIBRIO SPECIES: NOT DETECTED
Yersinia enterocolitica: NOT DETECTED

## 2017-12-11 LAB — C DIFFICILE QUICK SCREEN W PCR REFLEX
C DIFFICILE (CDIFF) INTERP: NOT DETECTED
C DIFFICILE (CDIFF) TOXIN: NEGATIVE
C DIFFICLE (CDIFF) ANTIGEN: NEGATIVE

## 2017-12-11 LAB — CYTOLOGY - PAP
DIAGNOSIS: NEGATIVE
HPV: NOT DETECTED

## 2017-12-11 LAB — THYROID PEROXIDASE ANTIBODY: THYROID PEROXIDASE ANTIBODY: 3 [IU]/mL (ref <9)

## 2017-12-13 NOTE — Progress Notes (Signed)
Chief Complaint  Patient presents with  . Annual Exam   Annual  1. Reviewed labs  2. C/o diarrhea new onset nothing tried. 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 HLD pt wants to try diet and exercise, fish oil and red yeast rice 1st declines statin  4. Abnormal tfts 5/30 will repeat today  5. HTN controlled on losartan 25 mg qd   Review of Systems  Constitutional: Negative for weight loss.  HENT: Negative for hearing loss.   Eyes: Negative for blurred vision.  Respiratory: Negative for shortness of breath.   Cardiovascular: Negative for chest pain.  Gastrointestinal: Positive for diarrhea.  Musculoskeletal: Negative for falls.  Skin: Negative for rash.  Neurological: Negative for headaches.  Psychiatric/Behavioral: Negative for depression.   Past Medical History:  Diagnosis Date  . Allergy   . Depression    Followed By Dr. Tora Perches [facility]  . Dysrhythmia   . Frequent headaches   . Heart murmur   . Hepatitis    C-pt took treatment 2015 and states does not have Hep C now (02-10-15)  . Herpes genitalis in women    Past Surgical History:  Procedure Laterality Date  . [place]  . CHOLECYSTECTOMY N/A 02/16/2015   Procedure: LAPAROSCOPIC CHOLECYSTECTOMY;  Surgeon: Marlyce Huge, MD;  Location: [facility] ORS;  Service: General;  Laterality: N/A;  . SEPTOPLASTY  1993   Deviated Septum  . WRIST FRACTURE SURGERY     Family History  Problem Relation Age of Onset  . Cancer Mother        uterine cancer  . Depression Mother   . Hyperlipidemia Father   . Hypertension Father   . Diabetes Father   . Hypertension Sister   . Depression Sister   . Hypertension Sister   . Hypertension Brother    Social History   Socioeconomic History  . Marital status: Single    Spouse name: Not on file  . Number of children: 3  . Years of education: 35  . Highest education level: Not on file  Occupational History  . Occupation: Engineer, materials: [employer]    Comment: [place]  . Financial resource strain: Not on file  . Food insecurity:    Worry: Not on file    Inability: Not on file  . Transportation needs:    Medical: Not on file    Non-medical: Not on file  Tobacco Use  . Smoking status: Never Smoker  . Smokeless tobacco: Never Used  Substance and Sexual Activity  . Alcohol use: Yes    Comment: 1 glass of wine weekly  . Drug use: No  . Sexual activity: Not on file  Lifestyle  . Physical activity:    Days per week: Not on file    Minutes per session: Not on file  . Stress: Not on file  Relationships  . Social connections:    Talks on phone: Not on file    Gets together: Not on file    Attends religious service: Not on file    Active member of club or organization: Not on file    Attends meetings of clubs or organizations: Not on file    Relationship status: Not on file  . Intimate partner violence:    Fear of current or ex partner: Not on file    Emotionally abused: Not on file  Physically abused: Not on file    Forced sexual activity: Not on file  Other Topics Concern  . Not on file  Social History Narrative   Michele Hammond grew up in [place], [place]. She is currently living in [place], [place]. She is living alone. 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 Michele Hammond enjoys riding her horses on her spare time. She has two horses and a pony.   Current Meds  Medication Sig  . buPROPion (WELLBUTRIN XL) 150 MG 24 hr tablet Take 450 mg by mouth every morning.   . Cholecalciferol 50000 units capsule Take 1 capsule (50,000 Units total) by mouth once a week.  . clotrimazole (LOTRIMIN) 1 % cream Apply 1 application topically 2 (two) times daily. Right leg  . hydrocortisone 2.5 % cream Apply topically 2 (two) times daily. Right leg  . losartan (COZAAR) 25 MG tablet Take 1 tablet (25 mg total) by mouth daily.  . Multiple Vitamin (MULTIVITAMIN) capsule Take 1 capsule by mouth  daily.  . pseudoephedrine (SUDAFED) 30 MG tablet   . sertraline (ZOLOFT) 25 MG tablet   . triamcinolone (NASACORT ALLERGY 24HR) 55 MCG/ACT AERO nasal inhaler Place 2 sprays into the nose daily.  Marland Kitchen VYVANSE 30 MG capsule   . zolpidem (AMBIEN) 10 MG tablet Take 10 mg by mouth at bedtime as needed for sleep.   Allergies  Allergen Reactions  . Amoxicillin Rash  . Penicillin G Rash  . Sulfa Antibiotics Rash   Recent Results (from the past 2160 hour(s))  Comprehensive metabolic panel     Status: None   Collection Time: 11/15/17  9:36 AM  Result Value Ref Range   Sodium 139 135 - 145 mEq/L   Potassium 4.0 3.5 - 5.1 mEq/L   Chloride 102 96 - 112 mEq/L   CO2 29 19 - 32 mEq/L   Glucose, Bld 95 70 - 99 mg/dL   BUN 11 6 - 23 mg/dL   Creatinine, Ser 0.67 0.40 - 1.20 mg/dL   Total Bilirubin 0.4 0.2 - 1.2 mg/dL   Alkaline Phosphatase 50 39 - 117 U/L   AST 16 0 - 37 U/L   ALT 15 0 - 35 U/L   Total Protein 7.4 6.0 - 8.3 g/dL   Albumin 4.5 3.5 - 5.2 g/dL   Calcium 9.6 8.4 - 10.5 mg/dL   GFR 96.93 >60.00 mL/min  CBC with Differential/Platelet     Status: None   Collection Time: 11/15/17  9:36 AM  Result Value Ref Range   WBC 5.7 4.0 - 10.5 K/uL   RBC 4.29 3.87 - 5.11 Mil/uL   Hemoglobin 12.9 12.0 - 15.0 g/dL   HCT 38.0 36.0 - 46.0 %   MCV 88.7 78.0 - 100.0 fl   MCHC 33.8 30.0 - 36.0 g/dL   RDW 13.7 11.5 - 15.5 %   Platelets 239.0 150.0 - 400.0 K/uL   Neutrophils Relative % 57.1 43.0 - 77.0 %   Lymphocytes Relative 34.1 12.0 - 46.0 %   Monocytes Relative 6.1 3.0 - 12.0 %   Eosinophils Relative 2.0 0.0 - 5.0 %   Basophils Relative 0.7 0.0 - 3.0 %   Neutro Abs 3.2 1.4 - 7.7 K/uL   Lymphs Abs 1.9 0.7 - 4.0 K/uL   Monocytes Absolute 0.3 0.1 - 1.0 K/uL   Eosinophils Absolute 0.1 0.0 - 0.7 K/uL   Basophils Absolute 0.0 0.0 - 0.1 K/uL  TSH     Status: Abnormal   Collection Time: 11/15/17  9:36 AM  Result Value Ref Range   TSH 6.54 (H) 0.35 - 4.50 uIU/mL  T4, free     Status: None    Collection Time: 11/15/17  9:36 AM  Result Value Ref Range   Free T4 0.71 0.60 - 1.60 ng/dL    Comment: Specimens from patients who are undergoing biotin therapy and /or ingesting biotin supplements may contain high levels of biotin.  The higher biotin concentration in these specimens interferes with this Free T4 assay.  Specimens that contain high levels  of biotin may cause false high results for this Free T4 assay.  Please interpret results in light of the total clinical presentation of the patient.    Hemoglobin A1c     Status: None   Collection Time: 11/15/17  9:36 AM  Result Value Ref Range   Hgb A1c MFr Bld 5.6 4.6 - 6.5 %    Comment: Glycemic Control Guidelines for People with Diabetes:Non Diabetic:  <6%Goal of Therapy: <7%Additional Action Suggested:  >8%   Lipid panel     Status: Abnormal   Collection Time: 11/15/17  9:36 AM  Result Value Ref Range   Cholesterol 263 (H) 0 - 200 mg/dL    Comment: ATP III Classification       Desirable:  < 200 mg/dL               Borderline High:  200 - 239 mg/dL          High:  > = 240 mg/dL   Triglycerides 164.0 (H) 0.0 - 149.0 mg/dL    Comment: Normal:  <150 mg/dLBorderline High:  150 - 199 mg/dL   HDL 57.30 >39.00 mg/dL   VLDL 32.8 0.0 - 40.0 mg/dL   LDL Cholesterol 172 (H) 0 - 99 mg/dL   Total CHOL/HDL Ratio 5     Comment:                Men          Women1/2 Average Risk     3.4          3.3Average Risk          5.0          4.42X Average Risk          9.6          7.13X Average Risk          15.0          11.0                       NonHDL 205.22     Comment: NOTE:  Non-HDL goal should be 30 mg/dL higher than patient's LDL goal (i.e. LDL goal of < 70 mg/dL, would have non-HDL goal of < 100 mg/dL)  Urinalysis, Routine w reflex microscopic     Status: None   Collection Time: 11/15/17  9:36 AM  Result Value Ref Range   Specific Gravity, UA 1.007 1.005 - 1.030   pH, UA 6.5 5.0 - 7.5   Color, UA Yellow Yellow   Appearance Ur Clear Clear    Leukocytes, UA Negative Negative   Protein, UA Negative Negative/Trace   Glucose, UA Negative Negative   Ketones, UA Negative Negative   RBC, UA Negative Negative   Bilirubin, UA Negative Negative   Urobilinogen, Ur 0.2 0.2 - 1.0 mg/dL   Nitrite, UA Negative Negative   Microscopic Examination Comment     Comment: Microscopic  not indicated and not performed.  Vitamin D (25 hydroxy)     Status: Abnormal   Collection Time: 11/15/17  9:36 AM  Result Value Ref Range   VITD 18.72 (L) 30.00 - 100.00 ng/mL  Antinuclear Antib (ANA)     Status: None   Collection Time: 11/15/17  9:36 AM  Result Value Ref Range   Anti Nuclear Antibody(ANA) NEGATIVE NEGATIVE    Comment: ANA IFA is a first line screen for detecting the presence of up to approximately 150 autoantibodies in various autoimmune diseases. A negative ANA IFA result suggests ANA-associated autoimmune diseases are not present at this time. . Visit Physician FAQs for interpretation of all antibodies in the [place], prevalence, and association with diseases at http://education.[facility].com/ KOE/CXF072 .   Hepatitis B surface antibody     Status: None   Collection Time: 11/15/17  9:36 AM  Result Value Ref Range   Hepatitis B-Post 76 > OR = 10 mIU/mL    Comment: . Patient has immunity to hepatitis B virus. . For additional information, please refer to http://education.[facility].com/faq/FAQ105 (This link is being provided for informational/ educational purposes only).   Hepatitis B surface antigen     Status: None   Collection Time: 11/15/17  9:36 AM  Result Value Ref Range   Hepatitis B Surface Ag NON-REACTIVE NON-REACTI  Hepatitis C antibody     Status: Abnormal   Collection Time: 11/15/17  9:36 AM  Result Value Ref Range   Hepatitis C Ab REACTIVE (A) NON-REACTI   SIGNAL TO CUT-OFF 21.00 (H) <1.00    Comment: . HCV antibody was reactive. The sample will be tested for HCV RNA by a Nucleic Acid Amplification  Test (NAAT) to determine if the patient has a current active infection. .   Measles/Mumps/Rubella Immunity     Status: Abnormal   Collection Time: 11/15/17  9:36 AM  Result Value Ref Range   Rubeola IgG <25.00 (L) AU/mL    Comment: AU/mL            Interpretation -----            -------------- <25.00           Negative 25.00-29.99      Equivocal >29.99           Positive . A positive result indicates that the patient has antibody to measles virus. It does not differentiate  between an active or past infection. The clinical  diagnosis must be interpreted in conjunction with  clinical signs and symptoms of the patient.    Mumps IgG 44.90 AU/mL    Comment:  AU/mL           Interpretation -------         ---------------- <9.00             Negative 9.00-10.99        Equivocal >10.99            Positive A positive result indicates that the patient has  antibody to mumps virus. It does not differentiate between an  active or past infection. The clinical diagnosis must be interpreted in conjunction with clinical signs and symptoms of the patient. .    Rubella 3.88 index    Comment:     Index            Interpretation     -----            --------------       <0.90  Not consistent with Immunity     0.90-0.99        Equivocal     > or = 1.00      Consistent with Immunity  . The presence of rubella IgG antibody suggests  immunization or past or current infection with rubella virus.   B. burgdorfi antibodies     Status: None   Collection Time: 11/15/17  9:36 AM  Result Value Ref Range   B burgdorferi Ab IgG+IgM <0.90 index    Comment:                    Index                Interpretation                    -----                --------------                    < 0.90               Negative                    0.90-1.09            Equivocal                    > 1.09               Positive . As recommended by the Food and Drug Administration  (FDA), all samples  with positive or equivocal  results in a Borrelia burgdorferi antibody screen will be tested using a blot method. Positive or  equivocal screening test results should not be  interpreted as truly positive until verified as such  using a supplemental assay (e.g., B. burgdorferi blot). . The screening test and/or blot for B. burgdorferi  antibodies may be falsely negative in early stages of Lyme disease, including the period when erythema  migrans is apparent. Marland Kitchen   HCV RNA, Quantitative Real Time PCR     Status: None   Collection Time: 11/15/17  9:36 AM  Result Value Ref Range   HCV RNA, PCR, QN <15 NOT DETECTED NOT DETECT IU/mL   HCV Quantitative Log <1.18 NOT DETECTED NOT DETECT Log IU/mL    Comment: . HCV RNA is not detected.  There is no laboratory  evidence of a current active HCV infection.  . This pattern of results (undetectable HCV RNA combined with reactive HCV antibody) could be consistent with a resolved past infection if the clinical history is  compatible with previous HCV exposure.  However, if no previous exposure is suspected, the reactive HCV  antibody could be a biological false positive result. . . This test was performed using Real-Time Polymerase Chain Reaction. . Reportable Range: 15 IU/mL to 100,000,000 IU/mL (1.18 Log IU/mL to 8.00 Log IU/mL). . The analytical performance characteristics of this assay have been determined by [facility].  The modifications have not been cleared or approved by the FDA. This assay has been validated pursuant to the  CLIA regulations and is used for clinical purposes.   . For more information on this test, go to: http://education.[facility].com/faq/FAQ22v1 (This link is being provided for in formational/ educational purposes only.) . This assay is intended for use as an aid in the diagnosis of HCV infection and the management of HCV infected patients undergoing anti-viral  therapy. Marland Kitchen   HIV antibody  (with reflex)     Status: None   Collection Time: 11/15/17  9:54 AM  Result Value Ref Range   HIV 1&2 Ab, 4th Generation NON-REACTIVE NON-REACTI    Comment: HIV-1 antigen and HIV-1/HIV-2 antibodies were not detected. There is no laboratory evidence of HIV infection. Marland Kitchen PLEASE NOTE: This information has been disclosed to you from records whose confidentiality may be protected by state law.  If your state requires such protection, then the state law prohibits you from making any further disclosure of the information without the specific written consent of the person to whom it pertains, or as otherwise permitted by law. A general authorization for the release of medical or other information is NOT sufficient for this purpose. . For additional information please refer to http://education.[facility].com/faq/FAQ106 (This link is being provided for informational/ educational purposes only.) . Marland Kitchen The performance of this assay has not been clinically validated in patients less than 43 years old. .   Cytology - PAP     Status: None   Collection Time: 12/10/17 12:00 AM  Result Value Ref Range   Adequacy      Satisfactory for evaluation  endocervical/transformation zone component PRESENT.   Diagnosis      NEGATIVE FOR INTRAEPITHELIAL LESIONS OR MALIGNANCY.   HPV NOT DETECTED     Comment: Normal Reference Range - NOT Detected   Material Submitted CervicoVaginal Pap [ThinPrep Imaged]   TSH     Status: None   Collection Time: 12/10/17  9:51 AM  Result Value Ref Range   TSH 3.54 0.35 - 4.50 uIU/mL  T4, free     Status: None   Collection Time: 12/10/17  9:51 AM  Result Value Ref Range   Free T4 0.75 0.60 - 1.60 ng/dL    Comment: Specimens from patients who are undergoing biotin therapy and /or ingesting biotin supplements may contain high levels of biotin.  The higher biotin concentration in these specimens interferes with this Free T4 assay.  Specimens that contain high levels  of  biotin may cause false high results for this Free T4 assay.  Please interpret results in light of the total clinical presentation of the patient.    T3, free     Status: None   Collection Time: 12/10/17  9:51 AM  Result Value Ref Range   T3, Free 2.9 2.3 - 4.2 pg/mL  Thyroid peroxidase antibody     Status: None   Collection Time: 12/10/17  9:51 AM  Result Value Ref Range   Thyroperoxidase Ab SerPl-aCnc 3 <9 IU/mL  C difficile quick scan w PCR reflex     Status: None   Collection Time: 12/11/17  1:15 PM  Result Value Ref Range   C Diff antigen NEGATIVE NEGATIVE   C Diff toxin NEGATIVE NEGATIVE   C Diff interpretation No C. difficile detected.     Comment: Performed at [facility], [address] [place], [place] [address]  Gastrointestinal Panel by PCR , Stool     Status: None   Collection Time: 12/11/17  1:15 PM  Result Value Ref Range   Campylobacter species NOT DETECTED NOT DETECTED   Plesimonas shigelloides NOT DETECTED NOT DETECTED   Salmonella species NOT DETECTED NOT DETECTED   Yersinia enterocolitica NOT DETECTED NOT DETECTED   Vibrio species NOT DETECTED NOT DETECTED   Vibrio cholerae NOT DETECTED NOT DETECTED   Enteroaggregative E coli (EAEC) NOT DETECTED NOT DETECTED   Enteropathogenic E coli (  EPEC) NOT DETECTED NOT DETECTED   Enterotoxigenic E coli (ETEC) NOT DETECTED NOT DETECTED   Shiga like toxin producing E coli (STEC) NOT DETECTED NOT DETECTED   Shigella/Enteroinvasive E coli (EIEC) NOT DETECTED NOT DETECTED   Cryptosporidium NOT DETECTED NOT DETECTED   Cyclospora cayetanensis NOT DETECTED NOT DETECTED   Entamoeba histolytica NOT DETECTED NOT DETECTED   Giardia lamblia NOT DETECTED NOT DETECTED   Adenovirus F40/41 NOT DETECTED NOT DETECTED   Astrovirus NOT DETECTED NOT DETECTED   Norovirus GI/GII NOT DETECTED NOT DETECTED   Rotavirus A NOT DETECTED NOT DETECTED   Sapovirus (I, II, IV, and V) NOT DETECTED NOT DETECTED    Comment: Performed at  [facility], [address] [place], [place] [address]   Objective  Body mass index is 31.18 kg/m. Wt Readings from Last 3 Encounters:  12/10/17 176 lb (79.8 kg)  11/15/17 183 lb 9.6 oz (83.3 kg)  08/21/17 178 lb (80.7 kg)   Temp Readings from Last 3 Encounters:  12/10/17 98 F (36.7 C) (Oral)  11/15/17 98.2 F (36.8 C) (Oral)  08/21/17 98.4 F (36.9 C)   BP Readings from Last 3 Encounters:  12/10/17 110/78  11/15/17 116/82  08/21/17 129/61   Pulse Readings from Last 3 Encounters:  12/10/17 72  11/15/17 72  08/21/17 93    Physical Exam  Constitutional: She is oriented to person, place, and time. Vital signs are normal. She appears well-developed and well-nourished. She is cooperative.  HENT:  Head: Normocephalic and atraumatic.  Mouth/Throat: Oropharynx is clear and moist and mucous membranes are normal.  Eyes: Pupils are equal, round, and reactive to light. Conjunctivae are normal.  Cardiovascular: Normal rate, regular rhythm and normal heart sounds.  Pulmonary/Chest: Effort normal and breath sounds normal. Right breast exhibits no inverted nipple, no mass, no nipple discharge, no skin change and no tenderness. Left breast exhibits no inverted nipple, no mass, no nipple discharge, no skin change and no tenderness. No breast swelling, tenderness, discharge or bleeding. Breasts are symmetrical.  Genitourinary: Vagina normal and uterus normal. Pelvic exam was performed with patient supine. Cervix exhibits no motion tenderness, no discharge and no friability. Right adnexum displays no mass, no tenderness and no fullness. Left adnexum displays no mass, no tenderness and no fullness.  Genitourinary Comments: Cervical stenosis   Neurological: She is alert and oriented to person, place, and time. Gait normal.  Skin: Skin is warm, dry and intact.  Psychiatric: She has a normal mood and affect. Her speech is normal and behavior is normal. Judgment and thought content  normal. Cognition and memory are normal.  Nursing note and vitals reviewed.   Assessment   1. Annual  2. HLD  3. Diarrhea  4. Abnormal tfts  5. HTN controlled  Plan  1. Breast exam and pap today  Flu shot had 2018  Tdap  utd  rec MMR Disc shingrix in future  H/o hep C prev tx'ed. Dr. Linus Salmons  Referred mammo prev sch 12/21/17  [facility] 07/07/09 need to get records  Referred dermatology pending appt     2. Declines statin wants to try lifestyle changes fish oil, red yeast rice  Consider zetia in future  Consider repeat lipid at f/u  3. GI path panel, C diff and O&P  4. Repeat thyroid labs  5. Cont meds  Provider: Dr. Olivia Mackie McLean-Scocuzza-Internal Medicine

## 2017-12-18 ENCOUNTER — Ambulatory Visit: Payer: Self-pay | Admitting: Internal Medicine

## 2017-12-19 DIAGNOSIS — F349 Persistent mood [affective] disorder, unspecified: Secondary | ICD-10-CM | POA: Diagnosis not present

## 2017-12-21 ENCOUNTER — Ambulatory Visit
Admission: RE | Admit: 2017-12-21 | Discharge: 2017-12-21 | Disposition: A | Discharge status: Home / Self Care | Payer: 59 | Source: Ambulatory Visit | Attending: Internal Medicine | Admitting: Internal Medicine

## 2017-12-21 DIAGNOSIS — Z1231 Encounter for screening mammogram for malignant neoplasm of breast: Secondary | ICD-10-CM | POA: Insufficient documentation

## 2018-01-03 DIAGNOSIS — F349 Persistent mood [affective] disorder, unspecified: Secondary | ICD-10-CM | POA: Diagnosis not present

## 2018-01-08 ENCOUNTER — Encounter: Payer: Self-pay | Admitting: Emergency Medicine

## 2018-01-08 ENCOUNTER — Ambulatory Visit
Admission: EM | Admit: 2018-01-08 | Discharge: 2018-01-08 | Disposition: A | Discharge status: Home / Self Care | Payer: 59 | Attending: Family Medicine | Admitting: Family Medicine

## 2018-01-08 ENCOUNTER — Other Ambulatory Visit: Payer: Self-pay

## 2018-01-08 DIAGNOSIS — J020 Streptococcal pharyngitis: Secondary | ICD-10-CM | POA: Diagnosis not present

## 2018-01-08 LAB — RAPID STREP SCREEN (MED CTR MEBANE ONLY): Streptococcus, Group A Screen (Direct): POSITIVE — AB

## 2018-01-08 MED ORDER — CEFDINIR 300 MG PO CAPS: 300.0000 mg | ORAL_CAPSULE | Freq: Two times a day (BID) | ORAL | 0 refills | Status: DC

## 2018-01-08 NOTE — ED Triage Notes (Signed)
Patient c/o sore throat that started on Sunday. She reports temp of 102.5 on Sunday, low grade temps since Sunday. Has been taking Ibuprofen as needed for the fever and pain.

## 2018-01-08 NOTE — ED Provider Notes (Signed)
[facility]    CSN: [id number] Arrival date & time: 01/08/18  1148  History   Chief Complaint Chief Complaint  Patient presents with  . Sore Throat    HPI  57 year old female presents with sore throat.  Patient reports that she developed sore throat on Sunday.  Severe.  Has persisted.  Associated fever, T-max 102.5.  She has taken ibuprofen without resolution.  She reports associated ear pain.  Difficulty swallowing.  No other exacerbating factors.  No relieving factors.  No other complaints.  Past Medical History:  Diagnosis Date  . Allergy   . Depression    Followed By Dr. Sallyanne Kuster [facility]  . Dysrhythmia   . Frequent headaches   . Heart murmur   . Hepatitis    C-pt took treatment 2015 and states does not have Hep C now (02-10-15)  . Herpes genitalis in women     Patient Active Problem List   Diagnosis Date Noted  . Annual physical exam 12/10/2017  . HLD (hyperlipidemia) 12/10/2017  . Fatty liver 11/15/2017  . HTN (hypertension) 11/15/2017  . History of hepatitis C 11/15/2017  . Tick bite 11/15/2017  . Calculus of gallbladder without cholecystitis without obstruction   . Screening for cervical cancer 07/25/2013  . Ulcer, cervix 07/25/2013  . Screening for breast cancer 06/30/2013    Past Surgical History:  Procedure Laterality Date  . BREAST BIOPSY Left 2005   benign  . CESAREAN SECTION  1990, 1996  . CHOLECYSTECTOMY N/A 02/16/2015   Procedure: LAPAROSCOPIC CHOLECYSTECTOMY;  Surgeon: Ida Rogue, MD;  Location: [facility] ORS;  Service: General;  Laterality: N/A;  . SEPTOPLASTY  1993   Deviated Septum  . WRIST FRACTURE SURGERY      OB History   None      Home Medications    Prior to Admission medications   Medication Sig Start Date End Date Taking? Authorizing Provider  buPROPion (WELLBUTRIN XL) 150 MG 24 hr tablet Take 450 mg by mouth every morning.    Yes [provider]  Cholecalciferol 50000 units capsule Take 1  capsule (50,000 Units total) by mouth once a week. 11/20/17  Yes McLean-Scocuzza, Pasty Spillers, MD  clotrimazole (LOTRIMIN) 1 % cream Apply 1 application topically 2 (two) times daily. Right leg 11/15/17  Yes McLean-Scocuzza, Pasty Spillers, MD  hydrocortisone 2.5 % cream Apply topically 2 (two) times daily. Right leg 11/15/17  Yes McLean-Scocuzza, Pasty Spillers, MD  losartan (COZAAR) 25 MG tablet Take 1 tablet (25 mg total) by mouth daily. 04/11/17  Yes Fisher, Roselyn Bering, PA-C  Multiple Vitamin (MULTIVITAMIN) capsule Take 1 capsule by mouth daily.   Yes [provider]  sertraline (ZOLOFT) 25 MG tablet  08/10/17  Yes [provider]  triamcinolone (NASACORT ALLERGY 24HR) 55 MCG/ACT AERO nasal inhaler Place 2 sprays into the nose daily.   Yes [provider]  VYVANSE 30 MG capsule  11/30/17  Yes [provider]  zolpidem (AMBIEN) 10 MG tablet Take 10 mg by mouth at bedtime as needed for sleep.   Yes [provider]  cefdinir (OMNICEF) 300 MG capsule Take 1 capsule (300 mg total) by mouth 2 (two) times daily. 01/08/18   Tommie Sams, DO    Family History Family History  Problem Relation Age of Onset  . Cancer Mother        uterine cancer  . Depression Mother   . Hyperlipidemia Father   . Hypertension Father   . Diabetes Father   .  Hypertension Sister   . Depression Sister   . Hypertension Sister   . Hypertension Brother     Social History Social History   Tobacco Use  . Smoking status: Never Smoker  . Smokeless tobacco: Never Used  Substance Use Topics  . Alcohol use: Yes    Comment: 1 glass of wine weekly  . Drug use: No     Allergies   Amoxicillin; Penicillin g; and Sulfa antibiotics   Review of Systems Review of Systems  Constitutional: Positive for fever.  HENT: Positive for ear pain and sore throat.    Physical Exam Triage Vital Signs ED Triage Vitals  Enc Vitals Group     BP 01/08/18 1232 137/88     Pulse Rate 01/08/18 1232 79     Resp  01/08/18 1232 18     Temp 01/08/18 1232 99.1 F (37.3 C)     Temp Source 01/08/18 1232 Oral     SpO2 01/08/18 1232 98 %     Weight 01/08/18 1230 174 lb (78.9 kg)     Height 01/08/18 1230 5\' 3"  (1.6 m)     Head Circumference --      Peak Flow --      Pain Score 01/08/18 1229 8     Pain Loc --      Pain Edu? --      Excl. in GC? --    Updated Vital Signs BP 137/88 (BP Location: Left Arm)   Pulse 79   Temp 99.1 F (37.3 C) (Oral)   Resp 18   Ht 5\' 3"  (1.6 m)   Wt 174 lb (78.9 kg)   LMP 11/19/2013   SpO2 98%   BMI 30.82 kg/m   Visual Acuity Right Eye Distance:   Left Eye Distance:   Bilateral Distance:    Right Eye Near:   Left Eye Near:    Bilateral Near:     Physical Exam  Constitutional: She is oriented to person, place, and time. She appears well-developed. No distress.  HENT:  Head: Normocephalic and atraumatic.  Oropharynx with severe erythema.  Tonsillar edema.  Uvula edema and erythema.  Uvula midline.  No evidence of peritonsillar abscess.  Cardiovascular: Normal rate and regular rhythm.  Pulmonary/Chest: Effort normal and breath sounds normal. She has no wheezes. She has no rales.  Neurological: She is alert and oriented to person, place, and time.  Psychiatric: She has a normal mood and affect. Her behavior is normal.  Nursing note and vitals reviewed.  UC Treatments / Results  Labs (all labs ordered are listed, but only abnormal results are displayed) Labs Reviewed  RAPID STREP SCREEN ([facility] ONLY) - Abnormal; Notable for the following components:      Result Value   Streptococcus, Group A Screen (Direct) POSITIVE (*)    All other components within normal limits    EKG None  Radiology No results found.  Procedures Procedures (including critical care time)  Medications Ordered in UC Medications - No data to display  Initial Impression / Assessment and Plan / UC Course  I have reviewed the triage vital signs and the nursing  notes.  Pertinent labs & imaging results that were available during my care of the patient were reviewed by me and considered in my medical decision making (see chart for details).    57 year old female presents with strep pharyngitis.  Treating with Omnicef as patient has a penicillin allergy which is mild.  Final Clinical Impressions(s) / UC  Diagnoses   Final diagnoses:  Strep pharyngitis   Discharge Instructions   None    ED Prescriptions    Medication Sig Dispense Auth. Provider   cefdinir (OMNICEF) 300 MG capsule Take 1 capsule (300 mg total) by mouth 2 (two) times daily. 20 capsule Tommie Samsook, Jaxsin Bottomley G, DO     Controlled Substance Prescriptions [place] Controlled Substance Registry consulted? Not Applicable   Tommie SamsCook, Latravia Southgate G, DO 01/08/18 1338

## 2018-01-17 DIAGNOSIS — F349 Persistent mood [affective] disorder, unspecified: Secondary | ICD-10-CM | POA: Diagnosis not present

## 2018-01-22 ENCOUNTER — Encounter: Payer: Self-pay | Admitting: Physician Assistant

## 2018-01-22 ENCOUNTER — Ambulatory Visit (INDEPENDENT_AMBULATORY_CARE_PROVIDER_SITE_OTHER): Payer: Self-pay | Admitting: Physician Assistant

## 2018-01-22 VITALS — BP 136/90 | HR 78 | Temp 98.3°F | Wt 176.0 lb

## 2018-01-22 DIAGNOSIS — J029 Acute pharyngitis, unspecified: Secondary | ICD-10-CM

## 2018-01-22 LAB — POCT RAPID STREP A (OFFICE): RAPID STREP A SCREEN: POSITIVE — AB

## 2018-01-22 MED ORDER — AZITHROMYCIN 250 MG PO TABS: ORAL_TABLET | ORAL | 0 refills | Status: AC

## 2018-01-22 MED ORDER — LIDOCAINE VISCOUS HCL 2 % MT SOLN
5.0000 mL | OROMUCOSAL | 0 refills | Status: DC | PRN
Start: 2018-01-22 — End: 2018-06-10

## 2018-01-22 NOTE — Progress Notes (Signed)
01/22/2018 1:57 PM   DOB: 07-30-1960 / MRN: [id number]  SUBJECTIVE:  Michele Hammond is a 57 y.o. female presenting for sore throat. Recently seen at ED and diagnosed with strep throat and completed treatment. Symptoms present for about 2 days.  The problem is worsening. She tells me her grandchild is a carrier for strep.   She is allergic to amoxicillin; penicillin g; and sulfa antibiotics.   She  has a past medical history of Allergy, Depression, Dysrhythmia, Frequent headaches, Heart murmur, Hepatitis, and Herpes genitalis in women.    She  reports that she has never smoked. She has never used smokeless tobacco. She reports that she drinks alcohol. She reports that she does not use drugs. She  has no sexual activity history on file. The patient  has a past surgical history that includes Cesarean section (1990, 1996); Septoplasty (1993); Wrist fracture surgery; Cholecystectomy (N/A, 02/16/2015); and Breast biopsy (Left, 2005).  Her family history includes Cancer in her mother; Depression in her mother and sister; Diabetes in her father; Hyperlipidemia in her father; Hypertension in her brother, father, sister, and sister.  Review of Systems  Respiratory: Negative for cough and shortness of breath.   Cardiovascular: Negative for chest pain and leg swelling.  Genitourinary: Negative for dysuria.    The problem list and medications were reviewed and updated by myself where necessary and exist elsewhere in the encounter.   OBJECTIVE:  BP 136/90   Pulse 78   Temp 98.3 F (36.8 C)   Wt 176 lb (79.8 kg)   LMP 11/19/2013   SpO2 98%   BMI 31.18 kg/m   Wt Readings from Last 3 Encounters:  01/22/18 176 lb (79.8 kg)  01/08/18 174 lb (78.9 kg)  12/10/17 176 lb (79.8 kg)   Temp Readings from Last 3 Encounters:  01/22/18 98.3 F (36.8 C)  01/08/18 99.1 F (37.3 C) (Oral)  12/10/17 98 F (36.7 C) (Oral)   BP Readings from Last 3 Encounters:  01/22/18 136/90  01/08/18 137/88  12/10/17  110/78   Pulse Readings from Last 3 Encounters:  01/22/18 78  01/08/18 79  12/10/17 72    Physical Exam  Constitutional: She is oriented to person, place, and time. She appears well-nourished. No distress.  HENT:  Mouth/Throat: Uvula is midline. Mucous membranes are not pale, not dry and not cyanotic. Posterior oropharyngeal erythema present. No oropharyngeal exudate, posterior oropharyngeal edema or tonsillar abscesses. Tonsils are 1+ on the right. Tonsils are 1+ on the left. No tonsillar exudate.  Eyes: Pupils are equal, round, and reactive to light. EOM are normal.  Cardiovascular: Normal rate, regular rhythm, S1 normal, S2 normal, normal heart sounds and intact distal pulses. Exam reveals no gallop, no friction rub and no decreased pulses.  No murmur heard. Pulmonary/Chest: Effort normal. No stridor. No respiratory distress. She has no wheezes. She has no rales.  Abdominal: She exhibits no distension.  Musculoskeletal: She exhibits no edema.  Neurological: She is alert and oriented to person, place, and time. No cranial nerve deficit. Gait normal.  Skin: Skin is dry. She is not diaphoretic.  Psychiatric: She has a normal mood and affect.  Vitals reviewed.   Lab Results  Component Value Date   HGBA1C 5.6 11/15/2017    Lab Results  Component Value Date   WBC 5.7 11/15/2017   HGB 12.9 11/15/2017   HCT 38.0 11/15/2017   MCV 88.7 11/15/2017   PLT 239.0 11/15/2017    Lab Results  Component  Value Date   CREATININE 0.67 11/15/2017   BUN 11 11/15/2017   NA 139 11/15/2017   K 4.0 11/15/2017   CL 102 11/15/2017   CO2 29 11/15/2017    Lab Results  Component Value Date   ALT 15 11/15/2017   AST 16 11/15/2017   ALKPHOS 50 11/15/2017   BILITOT 0.4 11/15/2017    Lab Results  Component Value Date   TSH 3.54 12/10/2017    Lab Results  Component Value Date   CHOL 263 (H) 11/15/2017   HDL 57.30 11/15/2017   LDLCALC 172 (H) 11/15/2017   TRIG 164.0 (H) 11/15/2017    CHOLHDL 5 11/15/2017    Results for orders placed or performed in visit on 01/22/18  POCT rapid strep A  Result Value Ref Range   Rapid Strep A Screen Positive (A) Negative      ASSESSMENT AND PLAN:  Diagnoses and all orders for this visit:  Sore throat Comments: RAS +.  Throat very erythematous. Will treat.  Orders: -     POCT rapid strep A  Other orders -     lidocaine (XYLOCAINE) 2 % solution; Use as directed 5-10 mLs in the mouth or throat as needed for mouth pain. -     azithromycin (ZITHROMAX) 250 MG tablet; Take two tabs on day on and one daily thereafter.    The patient is advised to call or return to clinic if she does not see an improvement in symptoms, or to seek the care of the closest emergency department if she worsens with the above plan.   Deliah BostonMichael Clark, MHS, PA-C [facility] 01/22/2018 1:57 PM

## 2018-01-24 ENCOUNTER — Telehealth: Payer: Self-pay | Admitting: Emergency Medicine

## 2018-01-24 NOTE — Telephone Encounter (Signed)
Left message following up on visit with [facility] 

## 2018-01-31 DIAGNOSIS — F349 Persistent mood [affective] disorder, unspecified: Secondary | ICD-10-CM | POA: Diagnosis not present

## 2018-02-07 ENCOUNTER — Encounter: Payer: Self-pay | Admitting: Family Medicine

## 2018-02-07 ENCOUNTER — Ambulatory Visit (INDEPENDENT_AMBULATORY_CARE_PROVIDER_SITE_OTHER): Payer: Self-pay | Admitting: Family Medicine

## 2018-02-07 VITALS — BP 120/86 | HR 69 | Temp 98.6°F | Wt 173.0 lb

## 2018-02-07 DIAGNOSIS — F349 Persistent mood [affective] disorder, unspecified: Secondary | ICD-10-CM | POA: Diagnosis not present

## 2018-02-07 DIAGNOSIS — J029 Acute pharyngitis, unspecified: Secondary | ICD-10-CM

## 2018-02-07 LAB — POCT RAPID STREP A (OFFICE): Rapid Strep A Screen: NEGATIVE

## 2018-02-07 MED ORDER — DOXYCYCLINE HYCLATE 100 MG PO CAPS: 100.0000 mg | ORAL_CAPSULE | Freq: Two times a day (BID) | ORAL | 0 refills | Status: DC

## 2018-02-07 MED ORDER — DOXYCYCLINE HYCLATE 100 MG PO CAPS: 100.0000 mg | ORAL_CAPSULE | Freq: Two times a day (BID) | ORAL | 0 refills | Status: AC

## 2018-02-07 NOTE — Progress Notes (Signed)
Patient ID: Nadara Mode, female    DOB: Dec 28, 1960, 57 y.o.   MRN: [id number]  PCP: McLean-Scocuzza, Pasty Spillers, MD  Chief Complaint  Patient presents with  . choice-sorethroat    Subjective:  HPI Michele Hammond is a 57 y.o. female presents for evaluation reoccurring sore throat.  SORE THROAT Onset: One month ago, tested positive for step and subsequently again, tested positive 10 days ago. Completed ZPAK 6 days ago with resolution of sore throat. Current episode of painful throat x 1 day.     Severity: moderate  Tried OTC meds without significant relief.  Symptoms:  + Fever  + Swollen neck glands + Myalgias  No Headache No Rash  No associated URI symptoms  Social History   Socioeconomic History  . Marital status: Single    Spouse name: Not on file  . Number of children: 3  . Years of education: 45  . Highest education level: Not on file  Occupational History  . Occupation: Animator: [employer]    Comment: [employer]  Social Needs  . Financial resource strain: Not on file  . Food insecurity:    Worry: Not on file    Inability: Not on file  . Transportation needs:    Medical: Not on file    Non-medical: Not on file  Tobacco Use  . Smoking status: Never Smoker  . Smokeless tobacco: Never Used  Substance and Sexual Activity  . Alcohol use: Yes    Comment: 1 glass of wine weekly  . Drug use: No  . Sexual activity: Not on file  Lifestyle  . Physical activity:    Days per week: Not on file    Minutes per session: Not on file  . Stress: Not on file  Relationships  . Social connections:    Talks on phone: Not on file    Gets together: Not on file    Attends religious service: Not on file    Active member of club or organization: Not on file    Attends meetings of clubs or organizations: Not on file    Relationship status: Not on file  . Intimate partner violence:    Fear of current or ex partner: Not on file    Emotionally  abused: Not on file    Physically abused: Not on file    Forced sexual activity: Not on file  Other Topics Concern  . Not on file  Social History Narrative   Junetta grew up in [place], [place]. She is currently living in [place], [place]. She is living alone. 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 Luvenia enjoys riding her horses on her spare time. She has two horses and a pony.    Family History  Problem Relation Age of Onset  . Cancer Mother        uterine cancer  . Depression Mother   . Hyperlipidemia Father   . Hypertension Father   . Diabetes Father   . Hypertension Sister   . Depression Sister   . Hypertension Sister   . Hypertension Brother    Review of Systems Pertinent negatives listed in HPI Patient Active Problem List   Diagnosis Date Noted  . Annual physical exam 12/10/2017  . HLD (hyperlipidemia) 12/10/2017  . Fatty liver 11/15/2017  . HTN (hypertension) 11/15/2017  . History of hepatitis C 11/15/2017  . Tick bite 11/15/2017  . Calculus of gallbladder without cholecystitis without obstruction   .  Screening for cervical cancer 07/25/2013  . Ulcer, cervix 07/25/2013  . Screening for breast cancer 06/30/2013    Allergies  Allergen Reactions  . Amoxicillin Rash  . Penicillin G Rash  . Sulfa Antibiotics Rash    Prior to Admission medications   Medication Sig Start Date End Date Taking? Authorizing Provider  buPROPion (WELLBUTRIN XL) 150 MG 24 hr tablet Take 450 mg by mouth every morning.    Yes [provider]  Cholecalciferol 50000 units capsule Take 1 capsule (50,000 Units total) by mouth once a week. 11/20/17  Yes McLean-Scocuzza, Pasty Spillersracy N, MD  losartan (COZAAR) 25 MG tablet Take 1 tablet (25 mg total) by mouth daily. 04/11/17  Yes Fisher, Roselyn BeringSusan W, PA-C  Multiple Vitamin (MULTIVITAMIN) capsule Take 1 capsule by mouth daily.   Yes [provider]  sertraline (ZOLOFT) 25 MG tablet  08/10/17  Yes [provider]  triamcinolone (NASACORT  ALLERGY 24HR) 55 MCG/ACT AERO nasal inhaler Place 2 sprays into the nose daily.   Yes [provider]  VYVANSE 30 MG capsule  11/30/17  Yes [provider]  zolpidem (AMBIEN) 10 MG tablet Take 10 mg by mouth at bedtime as needed for sleep.   Yes [provider]  clotrimazole (LOTRIMIN) 1 % cream Apply 1 application topically 2 (two) times daily. Right leg Patient not taking: Reported on 01/22/2018 11/15/17   McLean-Scocuzza, Pasty Spillersracy N, MD  doxycycline (VIBRAMYCIN) 100 MG capsule Take 1 capsule (100 mg total) by mouth 2 (two) times daily for 14 days. 02/07/18 02/21/18  Bing NeighborsHarris, Cattleya Dobratz S, FNP  hydrocortisone 2.5 % cream Apply topically 2 (two) times daily. Right leg Patient not taking: Reported on 01/22/2018 11/15/17   McLean-Scocuzza, Pasty Spillersracy N, MD  lidocaine (XYLOCAINE) 2 % solution Use as directed 5-10 mLs in the mouth or throat as needed for mouth pain. Patient not taking: Reported on 02/07/2018 01/22/18   Silvestre Mesilark, Michael L, PA-C    Past Medical, Surgical Family and Social History reviewed and updated.    Objective:   Today's Vitals   02/07/18 0826  BP: 120/86  Pulse: 69  Temp: 98.6 F (37 C)  SpO2: 98%  Weight: 173 lb (78.5 kg)  PainSc: 4   PainLoc: Throat    Wt Readings from Last 3 Encounters:  02/07/18 173 lb (78.5 kg)  01/22/18 176 lb (79.8 kg)  01/08/18 174 lb (78.9 kg)    Physical Exam  HENT:  Mouth/Throat: Uvula swelling present. Posterior oropharyngeal edema and posterior oropharyngeal erythema present. No tonsillar abscesses. Tonsils are 3+ on the right. Tonsils are 3+ on the left.  Cardiovascular: Normal rate and regular rhythm.  Pulmonary/Chest: Effort normal and breath sounds normal.   Assessment & Plan:  1. Pharyngitis, unspecified etiology, POCT rapid strep A, negative. Physical exam is consistent with pharyngitis although unable to identify associated etiology as throat cultures are unavailable at site. Will treat with an extended course of  Doxycyline given Penicillin allergy.  I recommend immediate follow-up with an ENT for evaluation of persistent unresolved pharyngitis.  Meds ordered this encounter  Medications  . DISCONTD: doxycycline (VIBRAMYCIN) 100 MG capsule    Sig: Take 1 capsule (100 mg total) by mouth 2 (two) times daily for 14 days.    Dispense:  28 capsule    Refill:  0  . doxycycline (VIBRAMYCIN) 100 MG capsule    Sig: Take 1 capsule (100 mg total) by mouth 2 (two) times daily for 14 days.    Dispense:  28 capsule  Refill:  0     If symptoms worsen or do not improve, return for follow-up, follow-up with PCP, or at the emergency department if severity of symptoms warrant a higher level of care.     Godfrey Pick. Tiburcio Pea, MSN, FNP-C [facility]  [address]  [place], [place] [address] [phone]

## 2018-02-07 NOTE — Patient Instructions (Signed)
  Please schedule an appointment with ENT for evaluation of recurring streptococcal infection.  Pharyngitis Pharyngitis is a sore throat (pharynx). There is redness, pain, and swelling of your throat. Follow these instructions at home:  Drink enough fluids to keep your pee (urine) clear or pale yellow.  Only take medicine as told by your doctor. ? You may get sick again if you do not take medicine as told. Finish your medicines, even if you start to feel better. ? Do not take aspirin.  Rest.  Rinse your mouth (gargle) with salt water ( tsp of salt per 1 qt of water) every 1-2 hours. This will help the pain.  If you are not at risk for choking, you can suck on hard candy or sore throat lozenges. Contact a doctor if:  You have large, tender lumps on your neck.  You have a rash.  You cough up green, yellow-brown, or bloody spit. Get help right away if:  You have a stiff neck.  You drool or cannot swallow liquids.  You throw up (vomit) or are not able to keep medicine or liquids down.  You have very bad pain that does not go away with medicine.  You have problems breathing (not from a stuffy nose). This information is not intended to replace advice given to you by your health care provider. Make sure you discuss any questions you have with your health care provider. Document Released: 11/22/2007 Document Revised: 11/11/2015 Document Reviewed: 02/10/2013 Elsevier Interactive Patient Education  2017 [employer]

## 2018-02-11 ENCOUNTER — Telehealth: Payer: Self-pay | Admitting: Emergency Medicine

## 2018-02-11 NOTE — Telephone Encounter (Signed)
Left message follow up call from visit with [facility]. 

## 2018-02-14 DIAGNOSIS — F349 Persistent mood [affective] disorder, unspecified: Secondary | ICD-10-CM | POA: Diagnosis not present

## 2018-02-15 DIAGNOSIS — J3501 Chronic tonsillitis: Secondary | ICD-10-CM | POA: Diagnosis not present

## 2018-02-15 DIAGNOSIS — K219 Gastro-esophageal reflux disease without esophagitis: Secondary | ICD-10-CM | POA: Diagnosis not present

## 2018-02-21 DIAGNOSIS — F349 Persistent mood [affective] disorder, unspecified: Secondary | ICD-10-CM | POA: Diagnosis not present

## 2018-02-28 DIAGNOSIS — F349 Persistent mood [affective] disorder, unspecified: Secondary | ICD-10-CM | POA: Diagnosis not present

## 2018-03-07 DIAGNOSIS — F349 Persistent mood [affective] disorder, unspecified: Secondary | ICD-10-CM | POA: Diagnosis not present

## 2018-03-20 DIAGNOSIS — F349 Persistent mood [affective] disorder, unspecified: Secondary | ICD-10-CM | POA: Diagnosis not present

## 2018-03-21 DIAGNOSIS — R0683 Snoring: Secondary | ICD-10-CM | POA: Diagnosis not present

## 2018-03-21 DIAGNOSIS — K219 Gastro-esophageal reflux disease without esophagitis: Secondary | ICD-10-CM | POA: Diagnosis not present

## 2018-03-21 DIAGNOSIS — J3501 Chronic tonsillitis: Secondary | ICD-10-CM | POA: Diagnosis not present

## 2018-03-28 DIAGNOSIS — F349 Persistent mood [affective] disorder, unspecified: Secondary | ICD-10-CM | POA: Diagnosis not present

## 2018-04-04 DIAGNOSIS — F349 Persistent mood [affective] disorder, unspecified: Secondary | ICD-10-CM | POA: Diagnosis not present

## 2018-04-11 ENCOUNTER — Ambulatory Visit: Payer: Self-pay | Admitting: Internal Medicine

## 2018-04-11 ENCOUNTER — Telehealth: Payer: Self-pay | Admitting: *Deleted

## 2018-04-11 DIAGNOSIS — Z0289 Encounter for other administrative examinations: Secondary | ICD-10-CM

## 2018-04-11 DIAGNOSIS — F349 Persistent mood [affective] disorder, unspecified: Secondary | ICD-10-CM | POA: Diagnosis not present

## 2018-04-11 NOTE — Telephone Encounter (Signed)
Copied from CRM [id number]. Topic: Quick Communication - Appointment Cancellation >> Apr 11, 2018  9:34 AM Gerrianne Scale wrote: Patient called to cancel appointment scheduled for 04-11-18. Patient has not rescheduled their appointment.  Route to department's PEC pool.

## 2018-04-18 DIAGNOSIS — F349 Persistent mood [affective] disorder, unspecified: Secondary | ICD-10-CM | POA: Diagnosis not present

## 2018-04-25 DIAGNOSIS — K219 Gastro-esophageal reflux disease without esophagitis: Secondary | ICD-10-CM | POA: Diagnosis not present

## 2018-04-25 DIAGNOSIS — R0683 Snoring: Secondary | ICD-10-CM | POA: Diagnosis not present

## 2018-05-02 DIAGNOSIS — F349 Persistent mood [affective] disorder, unspecified: Secondary | ICD-10-CM | POA: Diagnosis not present

## 2018-05-09 DIAGNOSIS — F349 Persistent mood [affective] disorder, unspecified: Secondary | ICD-10-CM | POA: Diagnosis not present

## 2018-05-13 DIAGNOSIS — G4733 Obstructive sleep apnea (adult) (pediatric): Secondary | ICD-10-CM | POA: Diagnosis not present

## 2018-05-13 DIAGNOSIS — I10 Essential (primary) hypertension: Secondary | ICD-10-CM | POA: Diagnosis not present

## 2018-05-13 DIAGNOSIS — F329 Major depressive disorder, single episode, unspecified: Secondary | ICD-10-CM | POA: Diagnosis not present

## 2018-05-13 DIAGNOSIS — E669 Obesity, unspecified: Secondary | ICD-10-CM | POA: Diagnosis not present

## 2018-05-15 DIAGNOSIS — F349 Persistent mood [affective] disorder, unspecified: Secondary | ICD-10-CM | POA: Diagnosis not present

## 2018-05-23 DIAGNOSIS — F349 Persistent mood [affective] disorder, unspecified: Secondary | ICD-10-CM | POA: Diagnosis not present

## 2018-05-30 DIAGNOSIS — F349 Persistent mood [affective] disorder, unspecified: Secondary | ICD-10-CM | POA: Diagnosis not present

## 2018-06-06 DIAGNOSIS — F349 Persistent mood [affective] disorder, unspecified: Secondary | ICD-10-CM | POA: Diagnosis not present

## 2018-06-10 ENCOUNTER — Encounter: Payer: Self-pay | Admitting: Physician Assistant

## 2018-06-10 ENCOUNTER — Ambulatory Visit (INDEPENDENT_AMBULATORY_CARE_PROVIDER_SITE_OTHER): Payer: Self-pay | Admitting: Physician Assistant

## 2018-06-10 VITALS — BP 108/80 | HR 77 | Temp 99.0°F | Resp 16 | Ht 63.0 in | Wt 171.0 lb

## 2018-06-10 DIAGNOSIS — J40 Bronchitis, not specified as acute or chronic: Secondary | ICD-10-CM

## 2018-06-10 DIAGNOSIS — R509 Fever, unspecified: Secondary | ICD-10-CM

## 2018-06-10 DIAGNOSIS — H6503 Acute serous otitis media, bilateral: Secondary | ICD-10-CM

## 2018-06-10 DIAGNOSIS — R0981 Nasal congestion: Secondary | ICD-10-CM

## 2018-06-10 LAB — POCT INFLUENZA A/B
INFLUENZA A, POC: NEGATIVE
INFLUENZA B, POC: NEGATIVE

## 2018-06-10 MED ORDER — DOXYCYCLINE HYCLATE 100 MG PO TABS: 100.0000 mg | ORAL_TABLET | Freq: Two times a day (BID) | ORAL | 0 refills | Status: DC

## 2018-06-10 MED ORDER — PREDNISONE 50 MG PO TABS
50.0000 mg | ORAL_TABLET | Freq: Every day | ORAL | 0 refills | Status: AC
Start: 2018-06-10 — End: 2018-06-15

## 2018-06-10 MED ORDER — PSEUDOEPH-BROMPHEN-DM 30-2-10 MG/5ML PO SYRP: 5.0000 mL | ORAL_SOLUTION | Freq: Four times a day (QID) | ORAL | 0 refills | Status: DC | PRN

## 2018-06-10 NOTE — Patient Instructions (Addendum)
Thank you for choosing [facility] for your health care needs.  You have been diagnosed with bronchitis (chest cold).  Recommend increase fluids; water, Gatorade, hot tea with lemon/honey, orange juice. Rest. Continue to use Nasacort nasal spray.  Take prescription medication at prescribed.  - doxycycline (VIBRA-TABS) 100 MG tablet; Take 1 tablet (100 mg total) by mouth 2 (two) times daily.  Dispense: 14 tablet; Refill: 0 - brompheniramine-pseudoephedrine-DM 30-2-10 MG/5ML syrup; Take 5 mLs by mouth 4 (four) times daily as needed.  Dispense: 120 mL; Refill: 0 - predniSONE (DELTASONE) 50 MG tablet; Take 1 tablet (50 mg total) by mouth daily with breakfast for 5 days.  Dispense: 5 tablet; Refill: 0  Follow-up with urgent care, ED, or your family physician in 2-3 days if not better. Sooner with worsening symptoms.  Acute Bronchitis, Adult Acute bronchitis is when air tubes (bronchi) in the lungs suddenly get swollen. The condition can make it hard to breathe. It can also cause these symptoms:  A cough.  Coughing up clear, yellow, or green mucus.  Wheezing.  Chest congestion.  Shortness of breath.  A fever.  Body aches.  Chills.  A sore throat. Follow these instructions at home:  Medicines  Take over-the-counter and prescription medicines only as told by your doctor.  If you were prescribed an antibiotic medicine, take it as told by your doctor. Do not stop taking the antibiotic even if you start to feel better. General instructions  Rest.  Drink enough fluids to keep your pee (urine) pale yellow.  Avoid smoking and secondhand smoke. If you smoke and you need help quitting, ask your doctor. Quitting will help your lungs heal faster.  Use an inhaler, cool mist vaporizer, or humidifier as told by your doctor.  Keep all follow-up visits as told by your doctor. This is important. How is this prevented? To lower your risk of getting this condition again:  Wash your hands  often with soap and water. If you cannot use soap and water, use hand sanitizer.  Avoid contact with people who have cold symptoms.  Try not to touch your hands to your mouth, nose, or eyes.  Make sure to get the flu shot every year. Contact a doctor if:  Your symptoms do not get better in 2 weeks. Get help right away if:  You cough up blood.  You have chest pain.  You have very bad shortness of breath.  You become dehydrated.  You faint (pass out) or keep feeling like you are going to pass out.  You keep throwing up (vomiting).  You have a very bad headache.  Your fever or chills gets worse. This information is not intended to replace advice given to you by your health care provider. Make sure you discuss any questions you have with your health care provider. Document Released: 11/22/2007 Document Revised: 01/17/2017 Document Reviewed: 11/24/2015 Elsevier Interactive Patient Education  2019 [employer]

## 2018-06-10 NOTE — Progress Notes (Signed)
Patient ID: Michele Hammond DOB: March 25, 1961 AGE: 57 y.o. MRN: [id number]   PCP: McLean-Scocuzza, Pasty Spillersracy N, MD   Chief Complaint:  Chief Complaint  Patient presents with  . Fatigue    x1wk  . Chest cong    x4d     Subjective:    HPI:  Michele Hammond is a 57 y.o. female presents for evaluation  Chief Complaint  Patient presents with  . Fatigue    x1wk  . Chest cong    x314d    57 year old female presents to [facility] with one week history of URI symptoms. Began with headache, nasal congestion, and bilateral maxillary sinus pressure/congestion. Initially had associated low grade fever, then for 2 days had fever of tmax 101F, now low grade again (32F). Has taken OTC Advil with minimal symptom relief (has not taken an antipyretic today). Associated fatigue and bodyaches. Over past 3 days has had cough with worsening chest congestion and wheezing. Cough coarse and productive. Associated bilateral ear pressure; left worse than right. Patient with seasonal allergies. Uses Nasacort daily. Used to be on Sudafed and antihistamine daily; discontinued under advise by ENT. Patient with long standing history of recurrent sinusitis; has undergone deviated septum repair and balloon sinuplasty. Denies dizziness/lightheadedness, ear discharge/drainage, epistaxis, sore throat, chest pain, SOB, nausea/vomiting, rash. Patient received this season's influenza vaccination. Works in [employer]; however, no specific known contact with influenza. No smoking history. No asthma, COPD, or emphysema history. Denies previous prescription for albuterol inhaler with bronchitis.  A limited review of symptoms was performed, pertinent positives and negatives as mentioned in HPI.  The following portions of the patient's history were reviewed and updated as appropriate: allergies, current medications and past medical history.  Patient Active Problem List   Diagnosis Date Noted  . Annual physical exam 12/10/2017  .  HLD (hyperlipidemia) 12/10/2017  . Fatty liver 11/15/2017  . HTN (hypertension) 11/15/2017  . History of hepatitis C 11/15/2017  . Tick bite 11/15/2017  . Calculus of gallbladder without cholecystitis without obstruction   . Screening for cervical cancer 07/25/2013  . Ulcer, cervix 07/25/2013  . Screening for breast cancer 06/30/2013    Allergies  Allergen Reactions  . Amoxicillin Rash  . Penicillin G Rash  . Sulfa Antibiotics Rash    Current Outpatient Medications on File Prior to Visit  Medication Sig Dispense Refill  . buPROPion (WELLBUTRIN XL) 150 MG 24 hr tablet Take 450 mg by mouth every morning.     . Cholecalciferol 50000 units capsule Take 1 capsule (50,000 Units total) by mouth once a week. 13 capsule 1  . losartan (COZAAR) 25 MG tablet Take 1 tablet (25 mg total) by mouth daily. 90 tablet 3  . Multiple Vitamin (MULTIVITAMIN) capsule Take 1 capsule by mouth daily.    . sertraline (ZOLOFT) 25 MG tablet   0  . triamcinolone (NASACORT ALLERGY 24HR) 55 MCG/ACT AERO nasal inhaler Place 2 sprays into the nose daily.    Marland Kitchen. VYVANSE 30 MG capsule   0  . zolpidem (AMBIEN) 10 MG tablet Take 10 mg by mouth at bedtime as needed for sleep.     No current facility-administered medications on file prior to visit.        Objective:   Vitals:   06/10/18 1120  BP: 108/80  Pulse: 77  Resp: 16  Temp: 99 F (37.2 C)  SpO2: 98%     Wt Readings from Last 3 Encounters:  06/10/18 171 lb (77.6 kg)  02/07/18 173 lb (78.5 kg)  01/22/18 176 lb (79.8 kg)    Physical Exam:   General Appearance:  Patient sitting comfortably on examination table. Conversational. Peri JeffersonGood self-historian. In no acute distress. Afebrile; 95F temperature.  Head:  Normocephalic, without obvious abnormality, atraumatic  Eyes:  PERRL, conjunctiva/corneas clear, EOM's intact  Ears:  Bilateral ear canals WNL. No erythema or edema. No discharge/drainage. Bilateral TMs with minimal effusion. Right TM with minimal  injection. No diffuse erythema. No perforation; no discharge/drainge. No bulging.  Nose: Nares normal. Deviated septum. Nasal mucosa with minimal edema and thick clear rhinorrhea. Nasally sounding voice. No sinus tenderness with percussion/palpation.  Throat: Lips, mucosa, and tongue normal; teeth and gums normal. Throat reveals no erythema. Tonsils with no enlargement or exudate. No visible postnasal drip.  Neck: Supple, symmetrical, trachea midline, no adenopathy  Lungs:   Auscultation of lungs reveals coarse breath sounds, primarily in bases bilaterally. Minimal expiratory wheezing. Coarse cough elicited with deep inspiration. Good aeration; 98% pulse ox.  Heart:  Regular rate and rhythm, S1 and S2 normal, no murmur, rub, or gallop  Extremities: Extremities normal, atraumatic, no cyanosis or edema  Pulses: 2+ and symmetric  Skin: Skin color, texture, turgor normal, no rashes or lesions  Lymph nodes: Cervical, supraclavicular, and axillary nodes normal  Neurologic: Normal    Assessment & Plan:    Exam findings, diagnosis etiology and medication use and indications reviewed with patient. Follow-Up and discharge instructions provided. No emergent/urgent issues found on exam.  Patient education was provided.   Patient verbalized understanding of information provided and agrees with plan of care (POC), all questions answered. The patient is advised to call or return to clinic if condition does not see an improvement in symptoms, or to seek the care of the closest emergency department if condition worsens with the below plan.    1. Nasal congestion  2. Non-recurrent acute serous otitis media of both ears  3. Bronchitis  - doxycycline (VIBRA-TABS) 100 MG tablet; Take 1 tablet (100 mg total) by mouth 2 (two) times daily.  Dispense: 14 tablet; Refill: 0 - brompheniramine-pseudoephedrine-DM 30-2-10 MG/5ML syrup; Take 5 mLs by mouth 4 (four) times daily as needed.  Dispense: 120 mL; Refill: 0 -  predniSONE (DELTASONE) 50 MG tablet; Take 1 tablet (50 mg total) by mouth daily with breakfast for 5 days.  Dispense: 5 tablet; Refill: 0  4. Fever, unspecified fever cause  - POCT Influenza A/B  Patient with one week history of URI symptoms. Intermittent fever. Worsening chest congestion with coarse lung sounds. Negative rapid flu test performed in clinic today. Will treat patient for possible bacterial bronchitis/lower respiratory tract infection. VSS. Patient in no acute distress. Prescribed 7-day course of Doxycycline 100mg  bid. Prescribed prednisone 50mg  qd x 5 days for congestion/wheezing and Bromfed cough syrup for symptom relief. Advised follow-up with urgent care, ED, or PCP in 2-3 days if not improving.    Janalyn HarderSamantha Aniaya Bacha, MHS, PA-C Rulon SeraSamantha F. Neriah Brott, MHS, PA-C Advanced Practice Provider [facility]  [facility]  [address], [address] [facility] [address] [facility], [address] [place], [place] [address] (p):  [phone] [email or web address]

## 2018-06-24 ENCOUNTER — Ambulatory Visit: Payer: Self-pay | Admitting: Gastroenterology

## 2018-06-27 DIAGNOSIS — F349 Persistent mood [affective] disorder, unspecified: Secondary | ICD-10-CM | POA: Diagnosis not present

## 2018-07-04 DIAGNOSIS — F349 Persistent mood [affective] disorder, unspecified: Secondary | ICD-10-CM | POA: Diagnosis not present

## 2018-07-11 DIAGNOSIS — F349 Persistent mood [affective] disorder, unspecified: Secondary | ICD-10-CM | POA: Diagnosis not present

## 2018-07-18 DIAGNOSIS — F349 Persistent mood [affective] disorder, unspecified: Secondary | ICD-10-CM | POA: Diagnosis not present

## 2018-07-25 DIAGNOSIS — F349 Persistent mood [affective] disorder, unspecified: Secondary | ICD-10-CM | POA: Diagnosis not present

## 2018-08-01 DIAGNOSIS — F349 Persistent mood [affective] disorder, unspecified: Secondary | ICD-10-CM | POA: Diagnosis not present

## 2018-08-15 DIAGNOSIS — F349 Persistent mood [affective] disorder, unspecified: Secondary | ICD-10-CM | POA: Diagnosis not present

## 2018-08-22 DIAGNOSIS — F349 Persistent mood [affective] disorder, unspecified: Secondary | ICD-10-CM | POA: Diagnosis not present

## 2018-09-05 DIAGNOSIS — F349 Persistent mood [affective] disorder, unspecified: Secondary | ICD-10-CM | POA: Diagnosis not present

## 2018-09-12 DIAGNOSIS — F349 Persistent mood [affective] disorder, unspecified: Secondary | ICD-10-CM | POA: Diagnosis not present

## 2018-10-03 DIAGNOSIS — F349 Persistent mood [affective] disorder, unspecified: Secondary | ICD-10-CM | POA: Diagnosis not present

## 2018-10-10 DIAGNOSIS — F349 Persistent mood [affective] disorder, unspecified: Secondary | ICD-10-CM | POA: Diagnosis not present

## 2018-10-17 DIAGNOSIS — F349 Persistent mood [affective] disorder, unspecified: Secondary | ICD-10-CM | POA: Diagnosis not present

## 2018-10-24 DIAGNOSIS — F349 Persistent mood [affective] disorder, unspecified: Secondary | ICD-10-CM | POA: Diagnosis not present

## 2018-10-31 DIAGNOSIS — F349 Persistent mood [affective] disorder, unspecified: Secondary | ICD-10-CM | POA: Diagnosis not present

## 2018-11-07 DIAGNOSIS — F349 Persistent mood [affective] disorder, unspecified: Secondary | ICD-10-CM | POA: Diagnosis not present

## 2018-11-14 DIAGNOSIS — F349 Persistent mood [affective] disorder, unspecified: Secondary | ICD-10-CM | POA: Diagnosis not present

## 2018-11-18 ENCOUNTER — Other Ambulatory Visit: Payer: Self-pay

## 2018-11-18 ENCOUNTER — Ambulatory Visit (INDEPENDENT_AMBULATORY_CARE_PROVIDER_SITE_OTHER): Payer: 59 | Admitting: Family Medicine

## 2018-11-18 DIAGNOSIS — H00011 Hordeolum externum right upper eyelid: Secondary | ICD-10-CM | POA: Diagnosis not present

## 2018-11-18 MED ORDER — CEPHALEXIN 500 MG PO CAPS
500.0000 mg | ORAL_CAPSULE | Freq: Four times a day (QID) | ORAL | 0 refills | Status: DC
Start: 2018-11-18 — End: 2019-09-08

## 2018-11-18 MED ORDER — POLYMYXIN B-TRIMETHOPRIM 10000-0.1 UNIT/ML-% OP SOLN: 1.0000 [drp] | OPHTHALMIC | 0 refills | Status: DC

## 2018-11-18 MED ORDER — ERYTHROMYCIN 5 MG/GM OP OINT: 1.0000 "application " | TOPICAL_OINTMENT | Freq: Every day | OPHTHALMIC | 0 refills | Status: DC

## 2018-11-18 NOTE — Progress Notes (Signed)
Patient ID: Michele Hammond, female   DOB: Feb 18, 1961, 58 y.o.   MRN: [id number]    Virtual Visit via video Note  This visit type was conducted due to national recommendations for restrictions regarding the COVID-19 pandemic (e.g. social distancing).  This format is felt to be most appropriate for this patient at this time.  All issues noted in this document were discussed and addressed.  No physical exam was performed (except for noted visual exam findings with Video Visits).   I connected with Michele Hammond today at 10:40 AM EDT by a video enabled telemedicine application and verified that I am speaking with the correct person using two identifiers. Location patient: home Location provider: [facility] Persons participating in the virtual visit: patient, provider  I discussed the limitations, risks, security and privacy concerns of performing an evaluation and management service by video and the availability of in person appointments. I also discussed with the patient that there may be a patient responsible charge related to this service. The patient expressed understanding and agreed to proceed.  HPI:  Patient and I connected via video due to suspected stye on right upper eyelid.  Patient states the area has been there for about 2 weeks and has not improved with use of warm compresses.  Patient states is now starting to become more itchy and has noticed some crusting and redness in her right eye.  Denies any loss of vision, no black spots in visual field or double vision.  Denies any thick discharge from the eye.  Denies any possibility of exposure to pinkeye.  No fever or chills.  No cough, shortness of breath or wheezing.  No chest pain.  No body aches.  No GI or GU complaints.   ROS: See pertinent positives and negatives per HPI.  Past Medical History:  Diagnosis Date  . Allergy   . Depression    Followed By Dr. Sallyanne Hammond [facility]  . Dysrhythmia   . Frequent headaches   . Heart  murmur   . Hepatitis    C-pt took treatment 2015 and states does not have Hep C now (02-10-15)  . Herpes genitalis in women     Past Surgical History:  Procedure Laterality Date  . BREAST BIOPSY Left 2005   benign  . CESAREAN SECTION  1990, 1996  . CHOLECYSTECTOMY N/A 02/16/2015   Procedure: LAPAROSCOPIC CHOLECYSTECTOMY;  Surgeon: Ida Roguehristopher Lundquist, MD;  Location: [facility] ORS;  Service: General;  Laterality: N/A;  . SEPTOPLASTY  1993   Deviated Septum  . WRIST FRACTURE SURGERY      Family History  Problem Relation Age of Onset  . Cancer Mother        uterine cancer  . Depression Mother   . Hyperlipidemia Father   . Hypertension Father   . Diabetes Father   . Hypertension Sister   . Depression Sister   . Hypertension Sister   . Hypertension Brother    Social History   Tobacco Use  . Smoking status: Never Smoker  . Smokeless tobacco: Never Used  Substance Use Topics  . Alcohol use: Yes    Comment: 1 glass of wine weekly    Current Outpatient Medications:  .  brompheniramine-pseudoephedrine-DM 30-2-10 MG/5ML syrup, Take 5 mLs by mouth 4 (four) times daily as needed., Disp: 120 mL, Rfl: 0 .  buPROPion (WELLBUTRIN XL) 150 MG 24 hr tablet, Take 450 mg by mouth every morning. , Disp: , Rfl:  .  Cholecalciferol 50000 units capsule,  Take 1 capsule (50,000 Units total) by mouth once a week., Disp: 13 capsule, Rfl: 1 .  doxycycline (VIBRA-TABS) 100 MG tablet, Take 1 tablet (100 mg total) by mouth 2 (two) times daily., Disp: 14 tablet, Rfl: 0 .  losartan (COZAAR) 25 MG tablet, Take 1 tablet (25 mg total) by mouth daily., Disp: 90 tablet, Rfl: 3 .  Multiple Vitamin (MULTIVITAMIN) capsule, Take 1 capsule by mouth daily., Disp: , Rfl:  .  sertraline (ZOLOFT) 25 MG tablet, , Disp: , Rfl: 0 .  triamcinolone (NASACORT ALLERGY 24HR) 55 MCG/ACT AERO nasal inhaler, Place 2 sprays into the nose daily., Disp: , Rfl:  .  VYVANSE 30 MG capsule, , Disp: , Rfl: 0 .  zolpidem (AMBIEN) 10 MG  tablet, Take 10 mg by mouth at bedtime as needed for sleep., Disp: , Rfl:   EXAM:  GENERAL: alert, oriented, appears well and in no acute distress  HEENT: atraumatic, no obvious abnormalities on inspection of external nose and ears. +sty right upper eyelid, small; some scattered redness in sclerae of right eye.  NECK: normal movements of the head and neck  LUNGS: on inspection no signs of respiratory distress, breathing rate appears normal, no obvious gross SOB, gasping or wheezing  CV: no obvious cyanosis  MS: moves all visible extremities without noticeable abnormality  PSYCH/NEURO: pleasant and cooperative, no obvious depression or anxiety, speech and thought processing grossly intact  ASSESSMENT AND PLAN:  Discussed the following assessment and plan:  Hordeolum externum of right upper eyelid - Plan: erythromycin ophthalmic ointment, trimethoprim-polymyxin b (POLYTRIM) ophthalmic solution, cephALEXin (KEFLEX) 500 MG capsule  Due to length of time stye being present and and not improving with use of warm compresses we will treat with erythromycin ointment at at bedtime daily for 1 week, Polytrim eyedrops every 4 hours during the day while awake and also oral Keflex.  Patient advised that if the stye does not improve over the next 2 to 3 days to call and let us know because at that time I will plan to refer to ophthalmology for evaluation and management.  Advised she can also continue to use cool compresses.  Also advised to change pillowcases, if having to wipe eye wipe from inner to outer corner and then if having to wipe again use a different tissue or different part of the tissue to wipe, avoid rubbing the eye.    I discussed the assessment and treatment plan with the patient. The patient was provided an opportunity to ask questions and all were answered. The patient agreed with the plan and demonstrated an understanding of the instructions.   The patient was advised to call back or  seek an in-person evaluation if the symptoms worsen or if the condition fails to improve as anticipated.  Tracey Harries, FNP

## 2018-11-21 DIAGNOSIS — F349 Persistent mood [affective] disorder, unspecified: Secondary | ICD-10-CM | POA: Diagnosis not present

## 2018-11-28 DIAGNOSIS — F349 Persistent mood [affective] disorder, unspecified: Secondary | ICD-10-CM | POA: Diagnosis not present

## 2018-12-05 DIAGNOSIS — F349 Persistent mood [affective] disorder, unspecified: Secondary | ICD-10-CM | POA: Diagnosis not present

## 2018-12-12 DIAGNOSIS — F349 Persistent mood [affective] disorder, unspecified: Secondary | ICD-10-CM | POA: Diagnosis not present

## 2018-12-26 DIAGNOSIS — F349 Persistent mood [affective] disorder, unspecified: Secondary | ICD-10-CM | POA: Diagnosis not present

## 2019-01-02 DIAGNOSIS — F349 Persistent mood [affective] disorder, unspecified: Secondary | ICD-10-CM | POA: Diagnosis not present

## 2019-01-09 DIAGNOSIS — F349 Persistent mood [affective] disorder, unspecified: Secondary | ICD-10-CM | POA: Diagnosis not present

## 2019-01-16 DIAGNOSIS — F349 Persistent mood [affective] disorder, unspecified: Secondary | ICD-10-CM | POA: Diagnosis not present

## 2019-01-23 DIAGNOSIS — F349 Persistent mood [affective] disorder, unspecified: Secondary | ICD-10-CM | POA: Diagnosis not present

## 2019-01-30 DIAGNOSIS — F349 Persistent mood [affective] disorder, unspecified: Secondary | ICD-10-CM | POA: Diagnosis not present

## 2019-02-06 DIAGNOSIS — F349 Persistent mood [affective] disorder, unspecified: Secondary | ICD-10-CM | POA: Diagnosis not present

## 2019-02-13 DIAGNOSIS — F349 Persistent mood [affective] disorder, unspecified: Secondary | ICD-10-CM | POA: Diagnosis not present

## 2019-02-20 DIAGNOSIS — F349 Persistent mood [affective] disorder, unspecified: Secondary | ICD-10-CM | POA: Diagnosis not present

## 2019-02-21 DIAGNOSIS — H524 Presbyopia: Secondary | ICD-10-CM | POA: Diagnosis not present

## 2019-02-27 DIAGNOSIS — F349 Persistent mood [affective] disorder, unspecified: Secondary | ICD-10-CM | POA: Diagnosis not present

## 2019-03-06 DIAGNOSIS — F349 Persistent mood [affective] disorder, unspecified: Secondary | ICD-10-CM | POA: Diagnosis not present

## 2019-03-13 DIAGNOSIS — F349 Persistent mood [affective] disorder, unspecified: Secondary | ICD-10-CM | POA: Diagnosis not present

## 2019-03-20 DIAGNOSIS — F349 Persistent mood [affective] disorder, unspecified: Secondary | ICD-10-CM | POA: Diagnosis not present

## 2019-03-27 DIAGNOSIS — F349 Persistent mood [affective] disorder, unspecified: Secondary | ICD-10-CM | POA: Diagnosis not present

## 2019-04-10 DIAGNOSIS — F349 Persistent mood [affective] disorder, unspecified: Secondary | ICD-10-CM | POA: Diagnosis not present

## 2019-04-17 DIAGNOSIS — F349 Persistent mood [affective] disorder, unspecified: Secondary | ICD-10-CM | POA: Diagnosis not present

## 2019-04-24 DIAGNOSIS — F349 Persistent mood [affective] disorder, unspecified: Secondary | ICD-10-CM | POA: Diagnosis not present

## 2019-05-01 DIAGNOSIS — F349 Persistent mood [affective] disorder, unspecified: Secondary | ICD-10-CM | POA: Diagnosis not present

## 2019-05-08 DIAGNOSIS — F349 Persistent mood [affective] disorder, unspecified: Secondary | ICD-10-CM | POA: Diagnosis not present

## 2019-05-22 DIAGNOSIS — F349 Persistent mood [affective] disorder, unspecified: Secondary | ICD-10-CM | POA: Diagnosis not present

## 2019-06-05 DIAGNOSIS — F349 Persistent mood [affective] disorder, unspecified: Secondary | ICD-10-CM | POA: Diagnosis not present

## 2019-06-26 DIAGNOSIS — F349 Persistent mood [affective] disorder, unspecified: Secondary | ICD-10-CM | POA: Diagnosis not present

## 2019-07-03 DIAGNOSIS — F349 Persistent mood [affective] disorder, unspecified: Secondary | ICD-10-CM | POA: Diagnosis not present

## 2019-07-10 DIAGNOSIS — F349 Persistent mood [affective] disorder, unspecified: Secondary | ICD-10-CM | POA: Diagnosis not present

## 2019-07-17 DIAGNOSIS — F349 Persistent mood [affective] disorder, unspecified: Secondary | ICD-10-CM | POA: Diagnosis not present

## 2019-07-24 DIAGNOSIS — F349 Persistent mood [affective] disorder, unspecified: Secondary | ICD-10-CM | POA: Diagnosis not present

## 2019-07-29 NOTE — Telephone Encounter (Signed)
error 

## 2019-07-31 DIAGNOSIS — F349 Persistent mood [affective] disorder, unspecified: Secondary | ICD-10-CM | POA: Diagnosis not present

## 2019-08-07 DIAGNOSIS — F349 Persistent mood [affective] disorder, unspecified: Secondary | ICD-10-CM | POA: Diagnosis not present

## 2019-08-08 DIAGNOSIS — M7661 Achilles tendinitis, right leg: Secondary | ICD-10-CM | POA: Diagnosis not present

## 2019-08-08 DIAGNOSIS — M722 Plantar fascial fibromatosis: Secondary | ICD-10-CM | POA: Diagnosis not present

## 2019-08-08 DIAGNOSIS — M79671 Pain in right foot: Secondary | ICD-10-CM | POA: Diagnosis not present

## 2019-08-08 DIAGNOSIS — G5751 Tarsal tunnel syndrome, right lower limb: Secondary | ICD-10-CM | POA: Diagnosis not present

## 2019-08-08 DIAGNOSIS — G8929 Other chronic pain: Secondary | ICD-10-CM | POA: Diagnosis not present

## 2019-08-14 DIAGNOSIS — F349 Persistent mood [affective] disorder, unspecified: Secondary | ICD-10-CM | POA: Diagnosis not present

## 2019-08-21 DIAGNOSIS — F349 Persistent mood [affective] disorder, unspecified: Secondary | ICD-10-CM | POA: Diagnosis not present

## 2019-08-28 DIAGNOSIS — F349 Persistent mood [affective] disorder, unspecified: Secondary | ICD-10-CM | POA: Diagnosis not present

## 2019-08-29 DIAGNOSIS — M7661 Achilles tendinitis, right leg: Secondary | ICD-10-CM | POA: Diagnosis not present

## 2019-08-29 DIAGNOSIS — M722 Plantar fascial fibromatosis: Secondary | ICD-10-CM | POA: Diagnosis not present

## 2019-08-29 DIAGNOSIS — G5751 Tarsal tunnel syndrome, right lower limb: Secondary | ICD-10-CM | POA: Diagnosis not present

## 2019-08-29 DIAGNOSIS — M216X1 Other acquired deformities of right foot: Secondary | ICD-10-CM | POA: Diagnosis not present

## 2019-09-08 ENCOUNTER — Emergency Department
Admission: EM | Admit: 2019-09-08 | Discharge: 2019-09-08 | Disposition: A | Discharge status: Home / Self Care | Payer: 59 | Attending: Emergency Medicine | Admitting: Emergency Medicine

## 2019-09-08 ENCOUNTER — Other Ambulatory Visit: Payer: Self-pay

## 2019-09-08 ENCOUNTER — Encounter: Payer: Self-pay | Admitting: Emergency Medicine

## 2019-09-08 ENCOUNTER — Emergency Department: Payer: 59

## 2019-09-08 ENCOUNTER — Telehealth: Payer: Self-pay | Admitting: Internal Medicine

## 2019-09-08 DIAGNOSIS — R519 Headache, unspecified: Secondary | ICD-10-CM

## 2019-09-08 DIAGNOSIS — J019 Acute sinusitis, unspecified: Secondary | ICD-10-CM | POA: Insufficient documentation

## 2019-09-08 DIAGNOSIS — Z20822 Contact with and (suspected) exposure to covid-19: Secondary | ICD-10-CM | POA: Diagnosis not present

## 2019-09-08 DIAGNOSIS — I10 Essential (primary) hypertension: Secondary | ICD-10-CM | POA: Diagnosis not present

## 2019-09-08 DIAGNOSIS — R202 Paresthesia of skin: Secondary | ICD-10-CM | POA: Insufficient documentation

## 2019-09-08 DIAGNOSIS — R011 Cardiac murmur, unspecified: Secondary | ICD-10-CM | POA: Insufficient documentation

## 2019-09-08 DIAGNOSIS — Z79899 Other long term (current) drug therapy: Secondary | ICD-10-CM | POA: Diagnosis not present

## 2019-09-08 DIAGNOSIS — H9313 Tinnitus, bilateral: Secondary | ICD-10-CM | POA: Insufficient documentation

## 2019-09-08 DIAGNOSIS — R002 Palpitations: Secondary | ICD-10-CM | POA: Diagnosis not present

## 2019-09-08 DIAGNOSIS — J329 Chronic sinusitis, unspecified: Secondary | ICD-10-CM

## 2019-09-08 LAB — CBC
HCT: 39.5 % (ref 36.0–46.0)
Hemoglobin: 13.6 g/dL (ref 12.0–15.0)
MCH: 30.4 pg (ref 26.0–34.0)
MCHC: 34.4 g/dL (ref 30.0–36.0)
MCV: 88.4 fL (ref 80.0–100.0)
Platelets: 202 10*3/uL (ref 150–400)
RBC: 4.47 MIL/uL (ref 3.87–5.11)
RDW: 12.5 % (ref 11.5–15.5)
WBC: 7.1 10*3/uL (ref 4.0–10.5)
nRBC: 0 % (ref 0.0–0.2)

## 2019-09-08 LAB — COMPREHENSIVE METABOLIC PANEL
ALT: 19 U/L (ref 0–44)
AST: 18 U/L (ref 15–41)
Albumin: 4.5 g/dL (ref 3.5–5.0)
Alkaline Phosphatase: 41 U/L (ref 38–126)
Anion gap: 9 (ref 5–15)
BUN: 14 mg/dL (ref 6–20)
CO2: 27 mmol/L (ref 22–32)
Calcium: 9.5 mg/dL (ref 8.9–10.3)
Chloride: 105 mmol/L (ref 98–111)
Creatinine, Ser: 0.62 mg/dL (ref 0.44–1.00)
GFR calc Af Amer: >60 mL/min (ref >60)
GFR calc non Af Amer: >60 mL/min (ref >60)
Glucose, Bld: 94 mg/dL (ref 70–99)
Potassium: 4 mmol/L (ref 3.5–5.1)
Sodium: 141 mmol/L (ref 135–145)
Total Bilirubin: 0.7 mg/dL (ref 0.3–1.2)
Total Protein: 7.4 g/dL (ref 6.5–8.1)

## 2019-09-08 LAB — LIPASE, BLOOD: Lipase: 52 U/L — ABNORMAL HIGH (ref 11–51)

## 2019-09-08 LAB — URINALYSIS, COMPLETE (UACMP) WITH MICROSCOPIC
Bilirubin Urine: NEGATIVE
Glucose, UA: NEGATIVE mg/dL
Hgb urine dipstick: NEGATIVE
Ketones, ur: NEGATIVE mg/dL
Leukocytes,Ua: NEGATIVE
Nitrite: NEGATIVE
Protein, ur: NEGATIVE mg/dL
Specific Gravity, Urine: 1.005 (ref 1.005–1.030)
Squamous Epithelial / LPF: NONE SEEN (ref 0–5)
pH: 5 (ref 5.0–8.0)

## 2019-09-08 LAB — T4, FREE: Free T4: 0.93 ng/dL (ref 0.61–1.12)

## 2019-09-08 LAB — TSH: TSH: 6.213 u[IU]/mL — ABNORMAL HIGH (ref 0.350–4.500)

## 2019-09-08 LAB — TROPONIN I (HIGH SENSITIVITY)
Troponin I (High Sensitivity): 27 ng/L — ABNORMAL HIGH (ref <18)
Troponin I (High Sensitivity): 18 ng/L — ABNORMAL HIGH (ref <18)

## 2019-09-08 MED ORDER — SODIUM CHLORIDE 0.9% FLUSH: 3.0000 mL | Freq: Once | INTRAVENOUS | Status: DC

## 2019-09-08 MED ORDER — DOXYCYCLINE MONOHYDRATE 100 MG PO TABS: 100.0000 mg | ORAL_TABLET | Freq: Two times a day (BID) | ORAL | 0 refills | Status: AC

## 2019-09-08 MED ORDER — ONDANSETRON 4 MG PO TBDP: 4.0000 mg | ORAL_TABLET | Freq: Three times a day (TID) | ORAL | 0 refills | Status: DC | PRN

## 2019-09-08 MED ORDER — BUTALBITAL-APAP-CAFFEINE 50-325-40 MG PO TABS: 1.0000 | ORAL_TABLET | Freq: Four times a day (QID) | ORAL | 0 refills | Status: DC | PRN

## 2019-09-08 NOTE — Telephone Encounter (Signed)
Left message to return call. Needing to schedule pt

## 2019-09-08 NOTE — ED Provider Notes (Signed)
[facility] Emergency Department Provider Note  ____________________________________________   First MD Initiated Contact with Patient 09/08/19 1300     (approximate)  I have reviewed the triage vital signs and the nursing notes.   HISTORY  Chief Complaint Hypertension    HPI Michele Hammond is a 59 y.o. female with previous hepatitis, depression who comes in for high blood pressure.  Patient states that she first started having some increase in diarrhea that was watery in nature.  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  This started around Friday, 4 days ago.  They were watery and nonbloody.She denies any abdominal pain with it except for some generalized cramps right before the diarrhea.  However then she started having other symptoms.  On Saturday she started to have some headaches that were gradually getting worse and then she had 2 episodes of nausea and vomiting associated with the headache.  These were nonbloody nonbilious.  She then started to notice that her blood pressures were more elevated.  She started to feel better when [place] then she started having the symptoms returned again today.  She states that now she is having ringing in her bilateral ears that she is had previously but seems to be worse, tingling sensation over her whole body, mild headache, occasional palpitations of her heart and the intermittent diarrhea.  She denies any antibiotic use or history of C. difficile or been around anybody who would be high risk for C. difficile.  She denies any fevers.  She has full range of motion of her neck.  She states that she has had a lot of stress with some family dynamics recently and does not know if that is contributing.           Past Medical History:  Diagnosis Date  . Allergy   . Depression    Followed By Dr. Sallyanne Kuster [facility]  . Dysrhythmia   . Frequent  headaches   . Heart murmur   . Hepatitis    C-pt took treatment 2015 and states does not have Hep C now (02-10-15)  . Herpes genitalis in women     Patient Active Problem List   Diagnosis Date Noted  . Annual physical exam 12/10/2017  . HLD (hyperlipidemia) 12/10/2017  . Fatty liver 11/15/2017  . HTN (hypertension) 11/15/2017  . History of hepatitis C 11/15/2017  . Tick bite 11/15/2017  . Calculus of gallbladder without cholecystitis without obstruction   . Screening for cervical cancer 07/25/2013  . Ulcer, cervix 07/25/2013  . Screening for breast cancer 06/30/2013    Past Surgical History:  Procedure Laterality Date  . BREAST BIOPSY Left 2005   benign  . CESAREAN SECTION  1990, 1996  . CHOLECYSTECTOMY N/A 02/16/2015   Procedure: LAPAROSCOPIC CHOLECYSTECTOMY;  Surgeon: Ida Rogue, MD;  Location: [facility] ORS;  Service: General;  Laterality: N/A;  . SEPTOPLASTY  1993   Deviated Septum  . WRIST FRACTURE SURGERY      Prior to Admission medications   Medication Sig Start Date End Date Taking? Authorizing Provider  buPROPion (WELLBUTRIN XL) 150 MG 24 hr tablet Take 450 mg by mouth every morning.     [provider]  Cholecalciferol 50000 units capsule Take 1 capsule (50,000 Units total) by mouth once a week. 11/20/17   McLean-Scocuzza, Pasty Spillers, MD  losartan (COZAAR) 25 MG tablet Take 1 tablet (  25 mg total) by mouth daily. 04/11/17   Versie Starks, PA-C  Multiple Vitamin (MULTIVITAMIN) capsule Take 1 capsule by mouth daily.    [provider]  sertraline (ZOLOFT) 25 MG tablet  08/10/17   [provider]  triamcinolone (NASACORT ALLERGY 24HR) 55 MCG/ACT AERO nasal inhaler Place 2 sprays into the nose daily.    [provider]  VYVANSE 30 MG capsule  11/30/17   [provider]  zolpidem (AMBIEN) 10 MG tablet Take 10 mg by mouth at bedtime as needed for sleep.    [provider]    Allergies Amoxicillin, Penicillin g, and  Sulfa antibiotics  Family History  Problem Relation Age of Onset  . Cancer Mother        uterine cancer  . Depression Mother   . Hyperlipidemia Father   . Hypertension Father   . Diabetes Father   . Hypertension Sister   . Depression Sister   . Hypertension Sister   . Hypertension Brother     Social History Social History   Tobacco Use  . Smoking status: Never Smoker  . Smokeless tobacco: Never Used  Substance Use Topics  . Alcohol use: Yes    Comment: 1 glass of wine weekly  . Drug use: No      Review of Systems Constitutional: No fever/chills Eyes: No visual changes. ENT: No sore throat.  Positive congestion Cardiovascular: Denies chest pain.  Positive palpitations Respiratory: Denies shortness of breath.  Denies cough Gastrointestinal: No abdominal pain.  Positive nausea and vomiting positive diarrhea no constipation. Genitourinary: Negative for dysuria. Musculoskeletal: Negative for back pain. Skin: Negative for rash. Neurological: Positive headaches, tingling focal weakness or numbness. All other ROS negative ____________________________________________   PHYSICAL EXAM:  VITAL SIGNS: ED Triage Vitals  Enc Vitals Group     BP 09/08/19 1144 (!) 147/90     Pulse Rate 09/08/19 1144 77     Resp 09/08/19 1144 20     Temp 09/08/19 1144 98.3 F (36.8 C)     Temp Source 09/08/19 1144 Oral     SpO2 09/08/19 1144 99 %     Weight 09/08/19 1145 175 lb (79.4 kg)     Height 09/08/19 1145 5\' 3"  (1.6 m)     Head Circumference --      Peak Flow --      Pain Score 09/08/19 1145 3     Pain Loc --      Pain Edu? --      Excl. in [place]? --     Constitutional: Alert and oriented. Well appearing and in no acute distress. Eyes: Conjunctivae are normal. EOMI. Head: Atraumatic. Nose: No congestion/rhinnorhea.  Mild sinus pressure Mouth/Throat: Mucous membranes are moist.   Neck: No stridor. Trachea Midline. FROM Cardiovascular: Normal rate, regular rhythm. Grossly  normal heart sounds.  Good peripheral circulation. Respiratory: Normal respiratory effort.  No retractions. Lungs CTAB. Gastrointestinal: Soft and nontender. No distention. No abdominal bruits.  Musculoskeletal: No lower extremity tenderness nor edema.  No joint effusions. Neurologic:  Normal speech and language.  Cranial 2 through 12 are intact.  Equal strength in arms and legs. Skin:  Skin is warm, dry and intact. No rash noted. Psychiatric: Mood and affect are normal. Speech and behavior are normal. GU: Deferred   ____________________________________________   LABS (all labs ordered are listed, but only abnormal results are displayed)  Labs Reviewed  LIPASE, BLOOD - Abnormal; Notable for the following components:  Result Value   Lipase 52 (*)    All other components within normal limits  URINALYSIS, COMPLETE (UACMP) WITH MICROSCOPIC - Abnormal; Notable for the following components:   Color, Urine STRAW (*)    APPearance CLEAR (*)    Bacteria, UA RARE (*)    All other components within normal limits  TSH - Abnormal; Notable for the following components:   TSH 6.213 (*)    All other components within normal limits  TROPONIN I (HIGH SENSITIVITY) - Abnormal; Notable for the following components:   Troponin I (High Sensitivity) 27 (*)    All other components within normal limits  TROPONIN I (HIGH SENSITIVITY) - Abnormal; Notable for the following components:   Troponin I (High Sensitivity) 18 (*)    All other components within normal limits  SARS CORONAVIRUS 2 (TAT 6-24 HRS)  COMPREHENSIVE METABOLIC PANEL  CBC  T4, FREE   ____________________________________________   ED ECG REPORT I, Concha Se, the attending physician, personally viewed and interpreted this ECG.  Normal sinus rate of 77, no st elevation, no twi, normal intervals, slightly short PR interval. ____________________________________________  RADIOLOGY   Official radiology report(s): CT Head Wo  Contrast  Result Date: 09/08/2019 CLINICAL DATA:  Headache, vomiting x2 days EXAM: CT HEAD WITHOUT CONTRAST TECHNIQUE: Contiguous axial images were obtained from the base of the skull through the vertex without intravenous contrast. COMPARISON:  04/03/2016 FINDINGS: Brain: No evidence of acute infarction, hemorrhage, hydrocephalus, extra-axial collection or mass lesion/mass effect. Vascular: Atherosclerotic and physiologic intracranial calcifications. Skull: Normal. Negative for fracture or focal lesion. Sinuses/Orbits: No acute finding. Other: None IMPRESSION: Negative Electronically Signed   By: Corlis Leak M.D.   On: 09/08/2019 13:46    ____________________________________________   PROCEDURES  Procedure(s) performed (including Critical Care):  Procedures   ____________________________________________   INITIAL IMPRESSION / ASSESSMENT AND PLAN / ED COURSE  LYNNIX SCHONEMAN was evaluated in Emergency Department on 09/08/2019 for the symptoms described in the history of present illness. She was evaluated in the context of the global COVID-19 pandemic, which necessitated consideration that the patient might be at risk for infection with the SARS-CoV-2 virus that causes COVID-19. Institutional protocols and algorithms that pertain to the evaluation of patients at risk for COVID-19 are in a state of rapid change based on information released by regulatory bodies including the CDC and federal and state organizations. These policies and algorithms were followed during the patient's care in the ED.    Patient is a 59 year old who presents with multiple symptoms including diarrhea although she denies any significant factors is unable to give a stool sample today and has a soft and nontender abdominal exam but no evidence of acute abdominal pathology, palpitations will get EKG and cardiac marker and thyroid studies, headaches with some neck stiffness although patient has full range of motion of neck.   Discussed with patient pros and cons of CT imaging and she like to proceed with CT head.  At this time of low suspicion for subarachnoid hemorrhage given her multiple symptoms and we had a discussion together decided to hold off on LP.  Also very low suspicion for meningitis given full range of motion of neck and no fevers and patient is very well-appearing.  Also very low suspicion for stroke given she is getting normal neuro exam. Will get CT to make sure no mass.  Will check labs to evaluate for electrolyte abnormalities given some tingling sensation.  This could be related to viral illness  versus patient does report a lot of stress recently.  Patient troponin did come back slightly elevated.  Repeat went from 27-18.  We discussed with patient she never felt any chest pain just some fluttering.  We discussed that this could be from an arrhythmia and I would recommend getting a Holter monitor.  We discussed admission versus following up with cardiology and patient felt comfortable following up with cardiology outpatient for Holter monitor and echocardiogram especially since patient never had any chest pain.  She denies any shortness of breath or risk factors for PE to suggest a pulmonary embolism.  Patient does report history of sinus infections and states that for the past few days she has felt like she has had a sinus infection and states that normally she gets better with antibiotics.  We discussed that normally symptoms have to be going on for at least 10 days for antibiotics to be warranted.  Patient states that she feels like it has been close to that.  Will prescribe some antibiotics and instructed patient to wait until its been 10 days and if her symptoms are not improving that she can start the antibiotics.  For patient's headaches her CT head was negative and at this time I have low suspicion for intracranial hemorrhage and we are electing to hold off on LP.  Will trial some Fioricet.  Discussed not  driving or working while on this  For patient's diarrhea she was unable to give a stool sample and she has no risk factors for C. Difficile.  Reevaluated patient and she feels comfortable going home at this time.  Urine without evidence of UTI.  Patient has been vaccinated but will do coronavirus testing to make sure no evidence of Covid given multiple symptoms.   I discussed the provisional nature of ED diagnosis, the treatment so far, the ongoing plan of care, follow up appointments and return precautions with the patient and any family or support people present. They expressed understanding and agreed with the plan, discharged home.      ____________________________________________   FINAL CLINICAL IMPRESSION(S) / ED DIAGNOSES   Final diagnoses:  Palpitations  Sinusitis, unspecified chronicity, unspecified location  Intractable headache, unspecified chronicity pattern, unspecified headache type      MEDICATIONS GIVEN DURING THIS VISIT:  Medications  sodium chloride flush (NS) 0.9 % injection 3 mL (3 mLs Intravenous Not Given 09/08/19 1301)     ED Discharge Orders         Ordered    doxycycline (ADOXA) 100 MG tablet  2 times daily     09/08/19 1625    ondansetron (ZOFRAN ODT) 4 MG disintegrating tablet  Every 8 hours PRN     09/08/19 1625    butalbital-acetaminophen-caffeine (FIORICET) 50-325-40 MG tablet  Every 6 hours PRN     09/08/19 1625           Note:  This document was prepared using Dragon voice recognition software and may include unintentional dictation errors.   Concha Se, MD 09/08/19 [phone]

## 2019-09-08 NOTE — Telephone Encounter (Signed)
PT states that her blood pressure was 170/110 and had severe headache along with tingling and nausea. Heart racing. I transferred to Clydie Braun at access nurse to triage.

## 2019-09-08 NOTE — Discharge Instructions (Addendum)
Your cardiac markers were slightly elevated but downtrending.  You should call the cardiologist to get a follow-up appointment for echocardiogram and Holter monitor.   For your possible sinus infection may start some doxycycline.  You take the Fioricet to help with headache.  Do not drive or work while on this.  The coronavirus test is pending he should stay quarantine for the next 24 hours until results.  You should follow-up with your primary care doctor for repeat blood pressure testing.  I did not restart your blood pressure medicine today since her blood pressures were 114/72 on repeat.  The risk of low blood pressure is worse.   Return to the ER if you develop shortness of breath, fevers, any other concerns

## 2019-09-08 NOTE — Telephone Encounter (Signed)
Agree  Lets sch f/u in the near future after urgent care I have not seen her in a while  Thanks TMS

## 2019-09-08 NOTE — ED Notes (Signed)
See triage note  States she has been feeling bad since Friday   Stats she developed h/a and neck pain  Described h/a as throbbing  ..then she developed some diarrhea    States she did feel a little better on Sunday  Some she tried to do something  became dizzy   States she just doesn't feel well

## 2019-09-08 NOTE — ED Triage Notes (Addendum)
Pt presents to ED via POV with c/o HTN, pt also c/o nausea and diarrhea. Pt also c/o HA and neck pain that started on Friday. Pt presents A&O x 4, NAD noted upon arrival to ED. Pt able to speak in full and complete sentences upon arrival to ED.   BP 147/90   Pt states on Sunday BP 170/110, also c/o dizziness/weakness/palpations.   Pt c/o continued all over tingliness and "shakiness" on the inside.

## 2019-09-08 NOTE — Telephone Encounter (Signed)
BP this morning 141/93  Pulse 82 now,nsome  nausea  today, patient still nauseous today and been having diarrhea a while, a week ago , 5 to 6 times per proabaly but differently up to 4 times a day, patient is having tingling sensation in her neck and spreads all over and constantly. 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 Patient stated she will go over to the [facility] walk in since she is in the hospital now.

## 2019-09-09 ENCOUNTER — Telehealth: Payer: Self-pay | Admitting: Internal Medicine

## 2019-09-09 ENCOUNTER — Encounter: Payer: Self-pay | Admitting: Internal Medicine

## 2019-09-09 NOTE — Telephone Encounter (Signed)
Pt scheduled for 10 am Thursday, 09/11/19. Virtual visit.

## 2019-09-09 NOTE — Telephone Encounter (Signed)
Please sch ed f/u   TMS

## 2019-09-09 NOTE — Telephone Encounter (Signed)
Pt scheduled for 10 am Thursday, 09/11/19.

## 2019-09-09 NOTE — Telephone Encounter (Signed)
Left message to return call 

## 2019-09-11 ENCOUNTER — Encounter: Payer: Self-pay | Admitting: Internal Medicine

## 2019-09-11 ENCOUNTER — Telehealth (INDEPENDENT_AMBULATORY_CARE_PROVIDER_SITE_OTHER): Payer: 59 | Admitting: Internal Medicine

## 2019-09-11 VITALS — BP 130/93 | Ht 63.0 in | Wt 175.0 lb

## 2019-09-11 DIAGNOSIS — F349 Persistent mood [affective] disorder, unspecified: Secondary | ICD-10-CM | POA: Diagnosis not present

## 2019-09-11 DIAGNOSIS — E785 Hyperlipidemia, unspecified: Secondary | ICD-10-CM | POA: Diagnosis not present

## 2019-09-11 DIAGNOSIS — R9431 Abnormal electrocardiogram [ECG] [EKG]: Secondary | ICD-10-CM

## 2019-09-11 DIAGNOSIS — E039 Hypothyroidism, unspecified: Secondary | ICD-10-CM | POA: Diagnosis not present

## 2019-09-11 DIAGNOSIS — R748 Abnormal levels of other serum enzymes: Secondary | ICD-10-CM

## 2019-09-11 DIAGNOSIS — K219 Gastro-esophageal reflux disease without esophagitis: Secondary | ICD-10-CM

## 2019-09-11 DIAGNOSIS — Z0184 Encounter for antibody response examination: Secondary | ICD-10-CM

## 2019-09-11 DIAGNOSIS — R7989 Other specified abnormal findings of blood chemistry: Secondary | ICD-10-CM

## 2019-09-11 DIAGNOSIS — R1013 Epigastric pain: Secondary | ICD-10-CM

## 2019-09-11 DIAGNOSIS — F419 Anxiety disorder, unspecified: Secondary | ICD-10-CM

## 2019-09-11 DIAGNOSIS — E559 Vitamin D deficiency, unspecified: Secondary | ICD-10-CM | POA: Diagnosis not present

## 2019-09-11 DIAGNOSIS — Z1231 Encounter for screening mammogram for malignant neoplasm of breast: Secondary | ICD-10-CM

## 2019-09-11 DIAGNOSIS — R002 Palpitations: Secondary | ICD-10-CM | POA: Diagnosis not present

## 2019-09-11 DIAGNOSIS — I10 Essential (primary) hypertension: Secondary | ICD-10-CM | POA: Diagnosis not present

## 2019-09-11 DIAGNOSIS — K760 Fatty (change of) liver, not elsewhere classified: Secondary | ICD-10-CM

## 2019-09-11 DIAGNOSIS — R778 Other specified abnormalities of plasma proteins: Secondary | ICD-10-CM

## 2019-09-11 DIAGNOSIS — E611 Iron deficiency: Secondary | ICD-10-CM

## 2019-09-11 DIAGNOSIS — E782 Mixed hyperlipidemia: Secondary | ICD-10-CM | POA: Diagnosis not present

## 2019-09-11 DIAGNOSIS — R0789 Other chest pain: Secondary | ICD-10-CM | POA: Diagnosis not present

## 2019-09-11 DIAGNOSIS — Z1159 Encounter for screening for other viral diseases: Secondary | ICD-10-CM

## 2019-09-11 DIAGNOSIS — R5383 Other fatigue: Secondary | ICD-10-CM

## 2019-09-11 DIAGNOSIS — R079 Chest pain, unspecified: Secondary | ICD-10-CM

## 2019-09-11 DIAGNOSIS — F439 Reaction to severe stress, unspecified: Secondary | ICD-10-CM

## 2019-09-11 MED ORDER — PANTOPRAZOLE SODIUM 20 MG PO TBEC: 20.0000 mg | DELAYED_RELEASE_TABLET | Freq: Every day | ORAL | 0 refills | Status: DC

## 2019-09-11 MED ORDER — METOPROLOL SUCCINATE ER 25 MG PO TB24: 25.0000 mg | ORAL_TABLET | Freq: Every day | ORAL | 3 refills | Status: DC

## 2019-09-11 MED ORDER — LEVOTHYROXINE SODIUM 25 MCG PO TABS: 25.0000 ug | ORAL_TABLET | Freq: Every day | ORAL | 3 refills | Status: DC

## 2019-09-11 NOTE — Progress Notes (Signed)
Telephone Note  I connected with Michele Hammond  on 09/11/19 at 10:40 AM EDT by telephone and verified that I am speaking with the correct person using two identifiers.  Location patient: work Environmental manager or home office Persons participating in the virtual visit: patient, provider  I discussed the limitations of evaluation and management by telemedicine and the availability of in person appointments. The patient expressed understanding and agreed to proceed.   HPI: 1. ED visit for chest pain with sinus rhythm and short PR interval EKG 09/08/19  She does have anxiety/stress and at times she feels heart racing, "butterflies", chest tightness, sob with exertion at times   Appt now with kc cards for further w/u today 09/11/19 and elevated troponin normal <18   2. Elevated lipase in ED 09/08/19 rare etoh use and has intermittent upper abdominal pain    3. GERD sx's at times she reports rare etoh use nothing tried. She does have diarrhea at times and intolerant to sugar foods s/p GB removal   4. Elevated TSH normal free t4 and c/o fatigue   Results for Michele Hammond, Michele Hammond (MRN [id number]) as of 09/15/2019 18:10  Ref. Range 11/15/2017 09:36 12/10/2017 09:51 09/08/2019 11:46  TSH Latest Ref Range: 0.350 - 4.500 uIU/mL 6.54 (H) 3.54 6.213 (H)  Triiodothyronine,Free,Serum Latest Ref Range: 2.3 - 4.2 pg/mL  2.9   T4,Free(Direct) Latest Ref Range: 0.61 - 1.12 ng/dL 0.71 0.75 0.93  Thyroperoxidase Ab SerPl-aCnc Latest Ref Range: <9 IU/mL  3     ROS: See pertinent positives and negatives per HPI.  Past Medical History:  Diagnosis Date  . Allergy   . Depression    Followed By Dr. Tora Perches [facility]  . Dysrhythmia   . Frequent headaches   . Heart murmur   . Hepatitis    C-pt took treatment 2015 and states does not have Hep C now (02-10-15)  . Herpes genitalis in women     Past Surgical History:  Procedure Laterality Date  . BREAST BIOPSY Left 2005   benign  . [place]   . CHOLECYSTECTOMY N/A 02/16/2015   Procedure: LAPAROSCOPIC CHOLECYSTECTOMY;  Surgeon: Marlyce Huge, MD;  Location: [facility] ORS;  Service: General;  Laterality: N/A;  . SEPTOPLASTY  1993   Deviated Septum  . WRIST FRACTURE SURGERY      Family History  Problem Relation Age of Onset  . Cancer Mother        uterine cancer  . Depression Mother   . Hyperlipidemia Father   . Hypertension Father   . Diabetes Father   . Hypertension Sister   . Depression Sister   . Hypertension Sister   . Hypertension Brother     SOCIAL HX:  Michele Hammond grew up in [place], [place]. She is currently living in [place], [place]. She is living alone. 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 Michele Hammond enjoys riding her horses on her spare time. She has two horses and a pony.   Current Outpatient Medications:  .  buPROPion (WELLBUTRIN XL) 150 MG 24 hr tablet, Take 450 mg by mouth every morning. , Disp: , Rfl:  .  butalbital-acetaminophen-caffeine (FIORICET) 50-325-40 MG tablet, Take 1-2 tablets by mouth every 6 (six) hours as needed for up to 10 days for headache., Disp: 15 tablet, Rfl: 0 .  doxycycline (ADOXA) 100 MG tablet, Take 1 tablet (100 mg total) by mouth 2 (two) times daily for 7 days., Disp: 14 tablet, Rfl: 0 .  Multiple Vitamin (MULTIVITAMIN) capsule, Take 1 capsule by mouth daily., Disp: , Rfl:  .  ondansetron (ZOFRAN ODT) 4 MG disintegrating tablet, Take 1 tablet (4 mg total) by mouth every 8 (eight) hours as needed for nausea or vomiting., Disp: 20 tablet, Rfl: 0 .  sertraline (ZOLOFT) 25 MG tablet, , Disp: , Rfl: 0 .  triamcinolone (NASACORT ALLERGY 24HR) 55 MCG/ACT AERO nasal inhaler, Place 2 sprays into the nose daily., Disp: , Rfl:  .  VYVANSE 30 MG capsule, , Disp: , Rfl: 0 .  zolpidem (AMBIEN) 10 MG tablet, Take 10 mg by mouth at bedtime as needed for sleep., Disp: , Rfl:  .  levothyroxine (SYNTHROID) 25 MCG tablet, Take 1 tablet (25 mcg total) by mouth daily before breakfast. 30 min before food,  Disp: 90 tablet, Rfl: 3 .  metoprolol succinate (TOPROL-XL) 25 MG 24 hr tablet, Take 1 tablet (25 mg total) by mouth at bedtime., Disp: 90 tablet, Rfl: 3 .  pantoprazole (PROTONIX) 20 MG tablet, Take 1 tablet (20 mg total) by mouth daily. 30 min before food, Disp: 90 tablet, Rfl: 0  EXAM:  VITALS per patient if applicable:  GENERAL: alert, oriented, appears well and in no acute distress  PSYCH/NEURO: pleasant and cooperative, no obvious depression or anxiety, speech and thought processing grossly intact  ASSESSMENT AND PLAN:  Discussed the following assessment and plan:  Palpitations - Plan: metoprolol succinate (TOPROL-XL) 25 MG 24 hr tablet,   Hypothyroidism, abnormal thyroid function   - Plan: levothyroxine (SYNTHROID) 25 MCG tablet, Thyroid peroxidase antibody, T3, free Repeat TSH in 6-8 weeks   Epigastric pain r/o pancreatitis with elevated lipase - Plan: US Abdomen Complete, Amylase  Consider CT scan in future to further w/u if [place] negative/indeterminate   Hyperlipidemia, unspecified hyperlipidemia type - Plan: Lipid panel Given info via my chart   Vitamin D deficiency - Plan: Vitamin D (25 hydroxy)  Gastroesophageal reflux disease without esophagitis -given info start protonix  Anxiety Stress - f/u she does f/u outpatient with psych and therapy continue Reduce caffeine   Shortened PR interval atypical Chest pain with elevated trop ED 09/08/19 -saw Dr. Lady Gary 09/11/19 and pending stress ekg, echo and holter f/u in 09/2019  Fatigue  W/u with further labs   HM Flu shot utd covid 2/2 tdap utd Hep B immune  Hep C reactive but VL negative s/p tx hep C Check hep A status   mammo ordered last 12/21/17 negative   Pap 12/10/17 neg neg HPV   Colonoscopy  Likely due external abstract w/o report 06/2009 Ext hemorrhoids ?IBS likely due will get cards w/u complete 1st   Skin address at f/u   DEXA consider age 83   Cards 09/11/19 Dr. Lady Gary ekg stress, holter, echo stress  ordered f/u 09/25/19 and 10/09/19    Neurology appt 09/18/19 Dr. Malvin Johns for h/a initial consult   -we discussed possible serious and likely etiologies, options for evaluation and workup, limitations of telemedicine visit vs in person visit, treatment, treatment risks and precautions. Pt prefers to treat via telemedicine empirically rather then risking or undertaking an in person visit at this moment. Patient agrees to seek prompt in person care if worsening, new symptoms arise, or if is not improving with treatment.   I discussed the assessment and treatment plan with the patient. The patient was provided an opportunity to ask questions and all were answered. The patient agreed with the plan and demonstrated an understanding of the instructions.   The patient  was advised to call back or seek an in-person evaluation if the symptoms worsen or if the condition fails to improve as anticipated.  Time spent 30 minutes Bevelyn Buckles, MD

## 2019-09-15 ENCOUNTER — Encounter: Payer: Self-pay | Admitting: Internal Medicine

## 2019-09-15 DIAGNOSIS — E039 Hypothyroidism, unspecified: Secondary | ICD-10-CM | POA: Insufficient documentation

## 2019-09-15 DIAGNOSIS — K219 Gastro-esophageal reflux disease without esophagitis: Secondary | ICD-10-CM | POA: Insufficient documentation

## 2019-09-15 DIAGNOSIS — F439 Reaction to severe stress, unspecified: Secondary | ICD-10-CM | POA: Insufficient documentation

## 2019-09-15 DIAGNOSIS — F419 Anxiety disorder, unspecified: Secondary | ICD-10-CM | POA: Insufficient documentation

## 2019-09-15 NOTE — Patient Instructions (Addendum)
I have ordered and abdomen ultrasound  Let me know if you would like me to order a thyroid ultrasound as well with your thyroid labs being elevated to look for nodules?   Please call [facility] and schedule your mammogram order is in  Review info below  Please f/u in 3 months in office  Please have fasting labs done at your job the orders are in take care   Levothyroxine tablets What is this medicine? LEVOTHYROXINE (lee voe thye ROX een) is a thyroid hormone. This medicine can improve symptoms of thyroid deficiency such as slow speech, lack of energy, weight gain, hair loss, dry skin, and feeling cold. It also helps to treat goiter (an enlarged thyroid gland). It is also used to treat some kinds of thyroid cancer along with surgery and other medicines. This medicine may be used for other purposes; ask your health care provider or pharmacist if you have questions. COMMON BRAND NAME(S): Estre, Euthyrox, Levo-T, Levothroid, Levoxyl, Synthroid, Thyro-Tabs, Unithroid What should I tell my health care provider before I take this medicine? They need to know if you have any of these conditions:  Addison's disease or other adrenal gland problem  angina  bone problems  diabetes  dieting or on a weight loss program  fertility problems  heart disease  pituitary gland problem  take medicines that treat or prevent blood clots  an unusual or allergic reaction to levothyroxine, thyroid hormones, other medicines, foods, dyes, or preservatives  pregnant or trying to get pregnant  breast-feeding How should I use this medicine? Take this medicine by mouth with plenty of water. It is best to take on an empty stomach, at least 30 minutes to one hour before breakfast. Avoid taking antacids containing aluminum or magnesium, simethicone, bile acid sequestrants, calcium carbonate, sodium polystyrene sulfonate, ferrous sulfate, sevelamer, lanthanum, or sucralfate within 4 hours of taking this medicine.  Follow the directions on the prescription label. Take at the same time each day. Do not take your medicine more often than directed. Contact your pediatrician regarding the use of this medicine in children. While this drug may be prescribed for children and infants as young as a few days of age for selected conditions, precautions do apply. For infants, you may crush the tablet and place in a small amount of (5 to 10 mL or 1 to 2 teaspoonfuls) of water, breast milk, or non-soy based infant formula. Do not mix with soy-based infant formula. Give as directed. Overdosage: If you think you have taken too much of this medicine contact a poison control center or emergency room at once. NOTE: This medicine is only for you. Do not share this medicine with others. What if I miss a dose? If you miss a dose, take it as soon as you can. If it is almost time for your next dose, take only that dose. Do not take double or extra doses. What may interact with this medicine?  amiodarone  antacids  anti-thyroid medicines  calcium supplements  carbamazepine  certain medicines for depression  certain medicines to treat cancer  cholestyramine  clofibrate  colesevelam  colestipol  digoxin  female hormones, like estrogens and birth control pills, patches, rings, or injections  iron supplements  ketamine  lanthanum  liquid nutrition products like Ensure  lithium  medicines for colds and breathing difficulties  medicines for diabetes  medicines or dietary supplements for weight loss  methadone  niacin  orlistat  oxandrolone  phenobarbital or other barbiturates  phenytoin  rifampin  sevelamer  simethicone  sodium polystyrene sulfonate  soy isoflavones  steroid medicines like prednisone or cortisone  sucralfate  testosterone  theophylline  warfarin This list may not describe all possible interactions. Give your health care provider a list of all the medicines,  herbs, non-prescription drugs, or dietary supplements you use. Also tell them if you smoke, drink alcohol, or use illegal drugs. Some items may interact with your medicine. What should I watch for while using this medicine? Be sure to take this medicine with plenty of fluids. Some tablets may cause choking, gagging, or difficulty swallowing from the tablet getting stuck in your throat. Most of these problems disappear if the medicine is taken with the right amount of water or other fluids. Do not switch brands of this medicine unless your health care professional agrees with the change. Ask questions if you are uncertain. You will need regular exams and occasional blood tests to check the response to treatment. If you are receiving this medicine for an underactive thyroid, it may be several weeks before you notice an improvement. Check with your doctor or health care professional if your symptoms do not improve. It may be necessary for you to take this medicine for the rest of your life. Do not stop using this medicine unless your doctor or health care professional advises you to. This medicine can affect blood sugar levels. If you have diabetes, check your blood sugar as directed. You may lose some of your hair when you first start treatment. With time, this usually corrects itself. If you are going to have surgery, tell your doctor or health care professional that you are taking this medicine. What side effects may I notice from receiving this medicine? Side effects that you should report to your doctor or health care professional as soon as possible:  allergic reactions like skin rash, itching or hives, swelling of the face, lips, or tongue  anxious  breathing problems  changes in menstrual periods  chest pain  diarrhea  excessive sweating or intolerance to heat  fast or irregular heartbeat  leg cramps  nervousness  swelling of ankles, feet, or legs  tremors  trouble  sleeping  vomiting Side effects that usually do not require medical attention (report to your doctor or health care professional if they continue or are bothersome):  changes in appetite  headache  irritable  nausea  weight loss This list may not describe all possible side effects. Call your doctor for medical advice about side effects. You may report side effects to FDA at 1-800-FDA-1088. Where should I keep my medicine? Keep out of the reach of children. Store at room temperature between 15 and 30 degrees C (59 and 86 degrees F). Protect from light and moisture. Keep container tightly closed. Throw away any unused medicine after the expiration date. NOTE: This sheet is a summary. It may not cover all possible information. If you have questions about this medicine, talk to your doctor, pharmacist, or health care provider.  2020 Elsevier/Gold Standard (2019-01-10 15:09:06)  Gastroesophageal Reflux Disease, Adult Gastroesophageal reflux (GER) happens when acid from the stomach flows up into the tube that connects the mouth and the stomach (esophagus). Normally, food travels down the esophagus and stays in the stomach to be digested. However, when a person has GER, food and stomach acid sometimes move back up into the esophagus. If this becomes a more serious problem, the person may be diagnosed with a disease called gastroesophageal reflux disease (GERD). GERD  occurs when the reflux:  Happens often.  Causes frequent or severe symptoms.  Causes problems such as damage to the esophagus. When stomach acid comes in contact with the esophagus, the acid may cause soreness (inflammation) in the esophagus. Over time, GERD may create small holes (ulcers) in the lining of the esophagus. What are the causes? This condition is caused by a problem with the muscle between the esophagus and the stomach (lower esophageal sphincter, or LES). Normally, the LES muscle closes after food passes through the  esophagus to the stomach. When the LES is weakened or abnormal, it does not close properly, and that allows food and stomach acid to go back up into the esophagus. The LES can be weakened by certain dietary substances, medicines, and medical conditions, including:  Tobacco use.  Pregnancy.  Having a hiatal hernia.  Alcohol use.  Certain foods and beverages, such as coffee, chocolate, onions, and peppermint. What increases the risk? You are more likely to develop this condition if you:  Have an increased body weight.  Have a connective tissue disorder.  Use NSAID medicines. What are the signs or symptoms? Symptoms of this condition include:  Heartburn.  Difficult or painful swallowing.  The feeling of having a lump in the throat.  Abitter taste in the mouth.  Bad breath.  Having a large amount of saliva.  Having an upset or bloated stomach.  Belching.  Chest pain. Different conditions can cause chest pain. Make sure you see your health care provider if you experience chest pain.  Shortness of breath or wheezing.  Ongoing (chronic) cough or a night-time cough.  Wearing away of tooth enamel.  Weight loss. How is this diagnosed? Your health care provider will take a medical history and perform a physical exam. To determine if you have mild or severe GERD, your health care provider may also monitor how you respond to treatment. You may also have tests, including:  A test to examine your stomach and esophagus with a small camera (endoscopy).  A test thatmeasures the acidity level in your esophagus.  A test thatmeasures how much pressure is on your esophagus.  A barium swallow or modified barium swallow test to show the shape, size, and functioning of your esophagus. How is this treated? The goal of treatment is to help relieve your symptoms and to prevent complications. Treatment for this condition may vary depending on how severe your symptoms are. Your health  care provider may recommend:  Changes to your diet.  Medicine.  Surgery. Follow these instructions at home: Eating and drinking   Follow a diet as recommended by your health care provider. This may involve avoiding foods and drinks such as: ? Coffee and tea (with or without caffeine). ? Drinks that containalcohol. ? Energy drinks and sports drinks. ? Carbonated drinks or sodas. ? Chocolate and cocoa. ? Peppermint and mint flavorings. ? Garlic and onions. ? Horseradish. ? Spicy and acidic foods, including peppers, chili powder, curry powder, vinegar, hot sauces, and barbecue sauce. ? Citrus fruit juices and citrus fruits, such as oranges, lemons, and limes. ? Tomato-based foods, such as red sauce, chili, salsa, and pizza with red sauce. ? Fried and fatty foods, such as donuts, french fries, potato chips, and high-fat dressings. ? High-fat meats, such as hot dogs and fatty cuts of red and white meats, such as rib eye steak, sausage, ham, and bacon. ? High-fat dairy items, such as whole milk, butter, and cream cheese.  Eat small, frequent meals  instead of large meals.  Avoid drinking large amounts of liquid with your meals.  Avoid eating meals during the 2-3 hours before bedtime.  Avoid lying down right after you eat.  Do not exercise right after you eat. Lifestyle   Do not use any products that contain nicotine or tobacco, such as cigarettes, e-cigarettes, and chewing tobacco. If you need help quitting, ask your health care provider.  Try to reduce your stress by using methods such as yoga or meditation. If you need help reducing stress, ask your health care provider.  If you are overweight, reduce your weight to an amount that is healthy for you. Ask your health care provider for guidance about a safe weight loss goal. General instructions  Pay attention to any changes in your symptoms.  Take over-the-counter and prescription medicines only as told by your health care  provider. Do not take aspirin, ibuprofen, or other NSAIDs unless your health care provider told you to do so.  Wear loose-fitting clothing. Do not wear anything tight around your waist that causes pressure on your abdomen.  Raise (elevate) the head of your bed about 6 inches (15 cm).  Avoid bending over if this makes your symptoms worse.  Keep all follow-up visits as told by your health care provider. This is important. Contact a health care provider if:  You have: ? New symptoms. ? Unexplained weight loss. ? Difficulty swallowing or it hurts to swallow. ? Wheezing or a persistent cough. ? A hoarse voice.  Your symptoms do not improve with treatment. Get help right away if you:  Have pain in your arms, neck, jaw, teeth, or back.  Feel sweaty, dizzy, or light-headed.  Have chest pain or shortness of breath.  Vomit and your vomit looks like blood or coffee grounds.  Faint.  Have stool that is bloody or black.  Cannot swallow, drink, or eat. Summary  Gastroesophageal reflux happens when acid from the stomach flows up into the esophagus. GERD is a disease in which the reflux happens often, causes frequent or severe symptoms, or causes problems such as damage to the esophagus.  Treatment for this condition may vary depending on how severe your symptoms are. Your health care provider may recommend diet and lifestyle changes, medicine, or surgery.  Contact a health care provider if you have new or worsening symptoms.  Take over-the-counter and prescription medicines only as told by your health care provider. Do not take aspirin, ibuprofen, or other NSAIDs unless your health care provider told you to do so.  Keep all follow-up visits as told by your health care provider. This is important. This information is not intended to replace advice given to you by your health care provider. Make sure you discuss any questions you have with your health care provider. Document Revised:  12/12/2017 Document Reviewed: 12/12/2017 Elsevier Patient Education  2020 [employer].  Food Choices for Gastroesophageal Reflux Disease, Adult When you have gastroesophageal reflux disease (GERD), the foods you eat and your eating habits are very important. Choosing the right foods can help ease the discomfort of GERD. Consider working with a diet and nutrition specialist (dietitian) to help you make healthy food choices. What general guidelines should I follow?  Eating plan  Choose healthy foods low in fat, such as fruits, vegetables, whole grains, low-fat dairy products, and lean meat, fish, and poultry.  Eat frequent, small meals instead of three large meals each day. Eat your meals slowly, in a relaxed setting. Avoid bending over  or lying down until 2-3 hours after eating.  Limit high-fat foods such as fatty meats or fried foods.  Limit your intake of oils, butter, and shortening to less than 8 teaspoons each day.  Avoid the following: ? Foods that cause symptoms. These may be different for different people. Keep a food diary to keep track of foods that cause symptoms. ? Alcohol. ? Drinking large amounts of liquid with meals. ? Eating meals during the 2-3 hours before bed.  Cook foods using methods other than frying. This may include baking, grilling, or broiling. Lifestyle  Maintain a healthy weight. Ask your health care provider what weight is healthy for you. If you need to lose weight, work with your health care provider to do so safely.  Exercise for at least 30 minutes on 5 or more days each week, or as told by your health care provider.  Avoid wearing clothes that fit tightly around your waist and chest.  Do not use any products that contain nicotine or tobacco, such as cigarettes and e-cigarettes. If you need help quitting, ask your health care provider.  Sleep with the head of your bed raised. Use a wedge under the mattress or blocks under the bed frame to raise the  head of the bed. What foods are not recommended? The items listed may not be a complete list. Talk with your dietitian about what dietary choices are best for you. Grains Pastries or quick breads with added fat. [place] toast. Vegetables Deep fried vegetables. [place] fries. Any vegetables prepared with added fat. Any vegetables that cause symptoms. For some people this may include tomatoes and tomato products, chili peppers, onions and garlic, and horseradish. Fruits Any fruits prepared with added fat. Any fruits that cause symptoms. For some people this may include citrus fruits, such as oranges, grapefruit, pineapple, and lemons. Meats and other protein foods High-fat meats, such as fatty beef or pork, hot dogs, ribs, ham, sausage, salami and bacon. Fried meat or protein, including fried fish and fried chicken. Nuts and nut butters. Dairy Whole milk and chocolate milk. Sour cream. Cream. Ice cream. Cream cheese. Milk shakes. Beverages Coffee and tea, with or without caffeine. Carbonated beverages. Sodas. Energy drinks. Fruit juice made with acidic fruits (such as orange or grapefruit). Tomato juice. Alcoholic drinks. Fats and oils Butter. Margarine. Shortening. Ghee. Sweets and desserts Chocolate and cocoa. Donuts. Seasoning and other foods Pepper. Peppermint and spearmint. Any condiments, herbs, or seasonings that cause symptoms. For some people, this may include curry, hot sauce, or vinegar-based salad dressings. Summary  When you have gastroesophageal reflux disease (GERD), food and lifestyle choices are very important to help ease the discomfort of GERD.  Eat frequent, small meals instead of three large meals each day. Eat your meals slowly, in a relaxed setting. Avoid bending over or lying down until 2-3 hours after eating.  Limit high-fat foods such as fatty meat or fried foods. This information is not intended to replace advice given to you by your health care provider. Make sure  you discuss any questions you have with your health care provider. Document Revised: 09/26/2018 Document Reviewed: 06/06/2016 Elsevier Patient Education  [place].    High Cholesterol  High cholesterol is a condition in which the blood has high levels of a white, waxy, fat-like substance (cholesterol). The human body needs small amounts of cholesterol. The liver makes all the cholesterol that the body needs. Extra (excess) cholesterol comes from the food that we eat. Cholesterol is  carried from the liver by the blood through the blood vessels. If you have high cholesterol, deposits (plaques) may build up on the walls of your blood vessels (arteries). Plaques make the arteries narrower and stiffer. Cholesterol plaques increase your risk for heart attack and stroke. Work with your health care provider to keep your cholesterol levels in a healthy range. What increases the risk? This condition is more likely to develop in people who:  Eat foods that are high in animal fat (saturated fat) or cholesterol.  Are overweight.  Are not getting enough exercise.  Have a family history of high cholesterol. What are the signs or symptoms? There are no symptoms of this condition. How is this diagnosed? This condition may be diagnosed from the results of a blood test.  If you are older than age 720, your health care provider may check your cholesterol every 4-6 years.  You may be checked more often if you already have high cholesterol or other risk factors for heart disease. The blood test for cholesterol measures:  "Bad" cholesterol (LDL cholesterol). This is the main type of cholesterol that causes heart disease. The desired level for LDL is less than 100.  "Good" cholesterol (HDL cholesterol). This type helps to protect against heart disease by cleaning the arteries and carrying the LDL away. The desired level for HDL is 60 or higher.  Triglycerides. These are fats that the body can  store or burn for energy. The desired number for triglycerides is lower than 150.  Total cholesterol. This is a measure of the total amount of cholesterol in your blood, including LDL cholesterol, HDL cholesterol, and triglycerides. A healthy number is less than 200. How is this treated? This condition is treated with diet changes, lifestyle changes, and medicines. Diet changes  This may include eating more whole grains, fruits, vegetables, nuts, and fish.  This may also include cutting back on red meat and foods that have a lot of added sugar. Lifestyle changes  Changes may include getting at least 40 minutes of aerobic exercise 3 times a week. Aerobic exercises include walking, biking, and swimming. Aerobic exercise along with a healthy diet can help you maintain a healthy weight.  Changes may also include quitting smoking. Medicines  Medicines are usually given if diet and lifestyle changes have failed to reduce your cholesterol to healthy levels.  Your health care provider may prescribe a statin medicine. Statin medicines have been shown to reduce cholesterol, which can reduce the risk of heart disease. Follow these instructions at home: Eating and drinking If told by your health care provider:  Eat chicken (without skin), fish, veal, shellfish, ground [place] breast, and round or loin cuts of red meat.  Do not eat fried foods or fatty meats, such as hot dogs and salami.  Eat plenty of fruits, such as apples.  Eat plenty of vegetables, such as broccoli, potatoes, and carrots.  Eat beans, peas, and lentils.  Eat grains such as barley, rice, couscous, and bulgur wheat.  Eat pasta without cream sauces.  Use skim or nonfat milk, and eat low-fat or nonfat yogurt and cheeses.  Do not eat or drink whole milk, cream, ice cream, egg yolks, or hard cheeses.  Do not eat stick margarine or tub margarines that contain trans fats (also called partially hydrogenated oils).  Do not eat  saturated tropical oils, such as coconut oil and palm oil.  Do not eat cakes, cookies, crackers, or other baked goods that contain trans fats.  General instructions  Exercise as directed by your health care provider. Increase your activity level with activities such as gardening, walking, and taking the stairs.  Take over-the-counter and prescription medicines only as told by your health care provider.  Do not use any products that contain nicotine or tobacco, such as cigarettes and e-cigarettes. If you need help quitting, ask your health care provider.  Keep all follow-up visits as told by your health care provider. This is important. Contact a health care provider if:  You are struggling to maintain a healthy diet or weight.  You need help to start on an exercise program.  You need help to stop smoking. Get help right away if:  You have chest pain.  You have trouble breathing. This information is not intended to replace advice given to you by your health care provider. Make sure you discuss any questions you have with your health care provider. Document Revised: 06/08/2017 Document Reviewed: 12/04/2015 Elsevier Patient Education  2020 [employer].  Cholesterol Content in Foods Cholesterol is a waxy, fat-like substance that helps to carry fat in the blood. The body needs cholesterol in small amounts, but too much cholesterol can cause damage to the arteries and heart. Most people should eat less than 200 milligrams (mg) of cholesterol a day. Foods with cholesterol  Cholesterol is found in animal-based foods, such as meat, seafood, and dairy. Generally, low-fat dairy and lean meats have less cholesterol than full-fat dairy and fatty meats. The milligrams of cholesterol per serving (mg per serving) of common cholesterol-containing foods are listed below. Meat and other proteins  Egg -- one large whole egg has 186 mg.  Veal shank -- 4 oz has 141 mg.  Lean ground [place] (93%  lean) -- 4 oz has 118 mg.  Fat-trimmed lamb loin -- 4 oz has 106 mg.  Lean ground beef (90% lean) -- 4 oz has 100 mg.  Lobster -- 3.5 oz has 90 mg.  Pork loin chops -- 4 oz has 86 mg.  Canned salmon -- 3.5 oz has 83 mg.  Fat-trimmed beef top loin -- 4 oz has 78 mg.  Frankfurter -- 1 frank (3.5 oz) has 77 mg.  Crab -- 3.5 oz has 71 mg.  Roasted chicken without skin, white meat -- 4 oz has 66 mg.  Light bologna -- 2 oz has 45 mg.  Deli-cut [place] -- 2 oz has 31 mg.  Canned tuna -- 3.5 oz has 31 mg.  Tomasa Blase -- 1 oz has 29 mg.  Oysters and mussels (raw) -- 3.5 oz has 25 mg.  Mackerel -- 1 oz has 22 mg.  Trout -- 1 oz has 20 mg.  Pork sausage -- 1 link (1 oz) has 17 mg.  Salmon -- 1 oz has 16 mg.  Tilapia -- 1 oz has 14 mg. Dairy  Soft-serve ice cream --  cup (4 oz) has 103 mg.  Whole-milk yogurt -- 1 cup (8 oz) has 29 mg.  Cheddar cheese -- 1 oz has 28 mg.  American cheese -- 1 oz has 28 mg.  Whole milk -- 1 cup (8 oz) has 23 mg.  2% milk -- 1 cup (8 oz) has 18 mg.  Cream cheese -- 1 tablespoon (Tbsp) has 15 mg.  Cottage cheese --  cup (4 oz) has 14 mg.  Low-fat (1%) milk -- 1 cup (8 oz) has 10 mg.  Sour cream -- 1 Tbsp has 8.5 mg.  Low-fat yogurt -- 1 cup (8 oz) has  8 mg.  Nonfat Greek yogurt -- 1 cup (8 oz) has 7 mg.  Half-and-half cream -- 1 Tbsp has 5 mg. Fats and oils  Cod liver oil -- 1 tablespoon (Tbsp) has 82 mg.  Butter -- 1 Tbsp has 15 mg.  Lard -- 1 Tbsp has 14 mg.  Bacon grease -- 1 Tbsp has 14 mg.  Mayonnaise -- 1 Tbsp has 5-10 mg.  Margarine -- 1 Tbsp has 3-10 mg. Exact amounts of cholesterol in these foods may vary depending on specific ingredients and brands. Foods without cholesterol Most plant-based foods do not have cholesterol unless you combine them with a food that has cholesterol. Foods without cholesterol include:  Grains and cereals.  Vegetables.  Fruits.  Vegetable oils, such as olive, canola, and  sunflower oil.  Legumes, such as peas, beans, and lentils.  Nuts and seeds.  Egg whites. Summary  The body needs cholesterol in small amounts, but too much cholesterol can cause damage to the arteries and heart.  Most people should eat less than 200 milligrams (mg) of cholesterol a day. This information is not intended to replace advice given to you by your health care provider. Make sure you discuss any questions you have with your health care provider. Document Revised: 05/18/2017 Document Reviewed: 01/30/2017 Elsevier Patient Education  2020 [employer].

## 2019-09-16 ENCOUNTER — Telehealth: Payer: Self-pay | Admitting: Internal Medicine

## 2019-09-16 ENCOUNTER — Other Ambulatory Visit: Payer: Self-pay

## 2019-09-16 ENCOUNTER — Ambulatory Visit
Admission: RE | Admit: 2019-09-16 | Discharge: 2019-09-16 | Disposition: A | Discharge status: Home / Self Care | Payer: 59 | Source: Ambulatory Visit | Attending: Internal Medicine | Admitting: Internal Medicine

## 2019-09-16 DIAGNOSIS — R1013 Epigastric pain: Secondary | ICD-10-CM | POA: Diagnosis not present

## 2019-09-16 NOTE — Telephone Encounter (Signed)
They are under future lab collect and cone  Is this not correct?   TMS

## 2019-09-16 NOTE — Telephone Encounter (Signed)
Please change lab orders so patient can have labs drawn tomorrow at [facility]. Thanks

## 2019-09-16 NOTE — Telephone Encounter (Signed)
Lab are in correctly

## 2019-09-18 DIAGNOSIS — F349 Persistent mood [affective] disorder, unspecified: Secondary | ICD-10-CM | POA: Diagnosis not present

## 2019-09-18 DIAGNOSIS — R200 Anesthesia of skin: Secondary | ICD-10-CM | POA: Diagnosis not present

## 2019-09-18 DIAGNOSIS — G5751 Tarsal tunnel syndrome, right lower limb: Secondary | ICD-10-CM | POA: Diagnosis not present

## 2019-09-18 DIAGNOSIS — R202 Paresthesia of skin: Secondary | ICD-10-CM | POA: Diagnosis not present

## 2019-09-18 DIAGNOSIS — M79671 Pain in right foot: Secondary | ICD-10-CM | POA: Diagnosis not present

## 2019-09-25 DIAGNOSIS — R0789 Other chest pain: Secondary | ICD-10-CM | POA: Diagnosis not present

## 2019-09-25 DIAGNOSIS — F349 Persistent mood [affective] disorder, unspecified: Secondary | ICD-10-CM | POA: Diagnosis not present

## 2019-09-29 DIAGNOSIS — R200 Anesthesia of skin: Secondary | ICD-10-CM | POA: Diagnosis not present

## 2019-10-02 DIAGNOSIS — F349 Persistent mood [affective] disorder, unspecified: Secondary | ICD-10-CM | POA: Diagnosis not present

## 2019-10-09 DIAGNOSIS — E782 Mixed hyperlipidemia: Secondary | ICD-10-CM | POA: Diagnosis not present

## 2019-10-09 DIAGNOSIS — I10 Essential (primary) hypertension: Secondary | ICD-10-CM | POA: Diagnosis not present

## 2019-10-09 DIAGNOSIS — F419 Anxiety disorder, unspecified: Secondary | ICD-10-CM | POA: Diagnosis not present

## 2019-10-09 DIAGNOSIS — R002 Palpitations: Secondary | ICD-10-CM | POA: Diagnosis not present

## 2019-10-09 DIAGNOSIS — F349 Persistent mood [affective] disorder, unspecified: Secondary | ICD-10-CM | POA: Diagnosis not present

## 2019-10-16 DIAGNOSIS — F349 Persistent mood [affective] disorder, unspecified: Secondary | ICD-10-CM | POA: Diagnosis not present

## 2019-10-23 DIAGNOSIS — F349 Persistent mood [affective] disorder, unspecified: Secondary | ICD-10-CM | POA: Diagnosis not present

## 2019-11-06 DIAGNOSIS — F349 Persistent mood [affective] disorder, unspecified: Secondary | ICD-10-CM | POA: Diagnosis not present

## 2019-11-12 ENCOUNTER — Encounter: Payer: Self-pay | Admitting: Internal Medicine

## 2019-11-20 ENCOUNTER — Other Ambulatory Visit: Payer: Self-pay | Admitting: Neurology

## 2019-11-20 DIAGNOSIS — F349 Persistent mood [affective] disorder, unspecified: Secondary | ICD-10-CM | POA: Diagnosis not present

## 2019-11-20 DIAGNOSIS — F419 Anxiety disorder, unspecified: Secondary | ICD-10-CM | POA: Diagnosis not present

## 2019-11-20 DIAGNOSIS — G5751 Tarsal tunnel syndrome, right lower limb: Secondary | ICD-10-CM | POA: Diagnosis not present

## 2019-11-20 DIAGNOSIS — R200 Anesthesia of skin: Secondary | ICD-10-CM | POA: Diagnosis not present

## 2019-11-20 DIAGNOSIS — G629 Polyneuropathy, unspecified: Secondary | ICD-10-CM | POA: Diagnosis not present

## 2019-11-27 DIAGNOSIS — F349 Persistent mood [affective] disorder, unspecified: Secondary | ICD-10-CM | POA: Diagnosis not present

## 2019-12-18 ENCOUNTER — Ambulatory Visit: Payer: 59 | Admitting: Internal Medicine

## 2019-12-18 ENCOUNTER — Encounter: Payer: Self-pay | Admitting: Internal Medicine

## 2019-12-18 ENCOUNTER — Other Ambulatory Visit: Payer: Self-pay

## 2019-12-18 VITALS — BP 130/84 | HR 75 | Temp 98.4°F | Ht 63.0 in | Wt 175.4 lb

## 2019-12-18 DIAGNOSIS — R29898 Other symptoms and signs involving the musculoskeletal system: Secondary | ICD-10-CM | POA: Diagnosis not present

## 2019-12-18 DIAGNOSIS — Z8619 Personal history of other infectious and parasitic diseases: Secondary | ICD-10-CM

## 2019-12-18 DIAGNOSIS — R1013 Epigastric pain: Secondary | ICD-10-CM

## 2019-12-18 DIAGNOSIS — J3489 Other specified disorders of nose and nasal sinuses: Secondary | ICD-10-CM | POA: Diagnosis not present

## 2019-12-18 DIAGNOSIS — I714 Abdominal aortic aneurysm, without rupture, unspecified: Secondary | ICD-10-CM

## 2019-12-18 DIAGNOSIS — R748 Abnormal levels of other serum enzymes: Secondary | ICD-10-CM

## 2019-12-18 DIAGNOSIS — Z0184 Encounter for antibody response examination: Secondary | ICD-10-CM

## 2019-12-18 DIAGNOSIS — F349 Persistent mood [affective] disorder, unspecified: Secondary | ICD-10-CM | POA: Diagnosis not present

## 2019-12-18 DIAGNOSIS — R2689 Other abnormalities of gait and mobility: Secondary | ICD-10-CM

## 2019-12-18 DIAGNOSIS — Z1211 Encounter for screening for malignant neoplasm of colon: Secondary | ICD-10-CM

## 2019-12-18 DIAGNOSIS — Z0000 Encounter for general adult medical examination without abnormal findings: Secondary | ICD-10-CM

## 2019-12-18 DIAGNOSIS — H9313 Tinnitus, bilateral: Secondary | ICD-10-CM

## 2019-12-18 DIAGNOSIS — M545 Low back pain, unspecified: Secondary | ICD-10-CM

## 2019-12-18 DIAGNOSIS — Z1283 Encounter for screening for malignant neoplasm of skin: Secondary | ICD-10-CM | POA: Diagnosis not present

## 2019-12-18 DIAGNOSIS — G8929 Other chronic pain: Secondary | ICD-10-CM

## 2019-12-18 DIAGNOSIS — E039 Hypothyroidism, unspecified: Secondary | ICD-10-CM

## 2019-12-18 DIAGNOSIS — R42 Dizziness and giddiness: Secondary | ICD-10-CM

## 2019-12-18 DIAGNOSIS — E559 Vitamin D deficiency, unspecified: Secondary | ICD-10-CM

## 2019-12-18 DIAGNOSIS — Z13220 Encounter for screening for lipoid disorders: Secondary | ICD-10-CM

## 2019-12-18 DIAGNOSIS — E669 Obesity, unspecified: Secondary | ICD-10-CM

## 2019-12-18 DIAGNOSIS — K9041 Non-celiac gluten sensitivity: Secondary | ICD-10-CM

## 2019-12-18 DIAGNOSIS — E611 Iron deficiency: Secondary | ICD-10-CM

## 2019-12-18 DIAGNOSIS — M722 Plantar fascial fibromatosis: Secondary | ICD-10-CM | POA: Insufficient documentation

## 2019-12-18 DIAGNOSIS — Z1159 Encounter for screening for other viral diseases: Secondary | ICD-10-CM

## 2019-12-18 NOTE — Progress Notes (Signed)
Chief Complaint  Patient presents with  . Follow-up   Annual & f/u with multiple complaints  1. Left nose skin lesion agreeable check with dermatology has been there for a while w/o sx's 2. C/o dizziness/ears ringing and feeling off balance esp with stooping and walking feeding chickens will refer back to Dr .Elenore Rota and established with Dr. Malvin Johns pt to call and schedula appt  3. C/o leg weakness CT negative brain in 08/2019 will have her f/u Dr. Malvin Johns and consider EMG/NCS in the future she does have intermittent low back pain 4. [place] ab 08/2019 with 2.5 AAA needs [place] in 5 years 5. Epigastric pain intermittently and screening colonoscopy agreeable to referral [facility] GI Dr. Norma Fredrickson   Review of Systems  Constitutional: Negative for weight loss.  HENT: Negative for hearing loss.   Eyes: Negative for blurred vision.  Respiratory: Negative for shortness of breath.   Cardiovascular: Negative for chest pain.  Musculoskeletal: Positive for back pain.  Skin: Negative for rash.  Neurological: Positive for dizziness and weakness.       +leg weakness b/l   Psychiatric/Behavioral: Negative for depression.   Past Medical History:  Diagnosis Date  . Allergy   . Depression    Followed By Dr. Sallyanne Kuster [facility]  . Dysrhythmia   . Frequent headaches   . Heart murmur   . Hepatitis    C-pt took treatment 2015 and states does not have Hep C now (02-10-15)  . Herpes genitalis in women    Past Surgical History:  Procedure Laterality Date  . BREAST BIOPSY Left 2005   benign  . CESAREAN SECTION  1990, 1996  . CHOLECYSTECTOMY N/A 02/16/2015   Procedure: LAPAROSCOPIC CHOLECYSTECTOMY;  Surgeon: Ida Rogue, MD;  Location: [facility] ORS;  Service: General;  Laterality: N/A;  . SEPTOPLASTY  1993   Deviated Septum  . WRIST FRACTURE SURGERY     Family History  Problem Relation Age of Onset  . Cancer Mother        uterine cancer  . Depression Mother   . Hyperlipidemia Father   . Hypertension Father    . Diabetes Father   . Hypertension Sister   . Depression Sister   . Hypertension Sister   . Hypertension Brother    Social History   Socioeconomic History  . Marital status: Single    Spouse name: Not on file  . Number of children: 3  . Years of education: 50  . Highest education level: Not on file  Occupational History  . Occupation: Animator: [employer]    Comment: [employer] [place]  Tobacco Use  . Smoking status: Never Smoker  . Smokeless tobacco: Never Used  Substance and Sexual Activity  . Alcohol use: Yes    Comment: 1 glass of wine weekly  . Drug use: No  . Sexual activity: Not on file  Other Topics Concern  . Not on file  Social History Narrative   Asuncion grew up in [place], [place]. She is currently living in [place], [place]. She is living alone. 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 Lutisha enjoys riding her horses on her spare time. She has two horses and a pony.   Social Determinants of Health   Financial Resource Strain:   . Difficulty of Paying Living Expenses:   Food Insecurity:   . Worried About Programme researcher, broadcasting/film/video in the Last Year:   . [employer] of Food in the  Last Year:   Transportation Needs:   . Freight forwarder (Medical):   Marland Kitchen Lack of Transportation (Non-Medical):   Physical Activity:   . Days of Exercise per Week:   . Minutes of Exercise per Session:   Stress:   . Feeling of Stress :   Social Connections:   . Frequency of Communication with Friends and Family:   . Frequency of Social Gatherings with Friends and Family:   . Attends Religious Services:   . Active Member of Clubs or Organizations:   . Attends Banker Meetings:   Marland Kitchen Marital Status:   Intimate Partner Violence:   . Fear of Current or Ex-Partner:   . Emotionally Abused:   Marland Kitchen Physically Abused:   . Sexually Abused:    Current Meds  Medication Sig  . buPROPion (WELLBUTRIN XL) 150 MG 24 hr tablet Take 450 mg by mouth every morning.    . gabapentin (NEURONTIN) 300 MG capsule Take 300 mg by mouth 2 (two) times daily.  Marland Kitchen levothyroxine (SYNTHROID) 25 MCG tablet Take 1 tablet (25 mcg total) by mouth daily before breakfast. 30 min before food  . metoprolol succinate (TOPROL-XL) 25 MG 24 hr tablet Take 1 tablet (25 mg total) by mouth at bedtime.  . Multiple Vitamin (MULTIVITAMIN) capsule Take 1 capsule by mouth daily.  . pantoprazole (PROTONIX) 20 MG tablet Take 1 tablet (20 mg total) by mouth daily. 30 min before food  . sertraline (ZOLOFT) 25 MG tablet   . triamcinolone (NASACORT ALLERGY 24HR) 55 MCG/ACT AERO nasal inhaler Place 2 sprays into the nose daily.   Allergies  Allergen Reactions  . Amoxicillin Rash  . Penicillin G Rash  . Sulfa Antibiotics Rash   No results found for this or any previous visit (from the past 2160 hour(s)). Objective  Body mass index is 31.07 kg/m. Wt Readings from Last 3 Encounters:  12/18/19 175 lb 6.4 oz (79.6 kg)  09/11/19 175 lb (79.4 kg)  09/08/19 175 lb (79.4 kg)   Temp Readings from Last 3 Encounters:  12/18/19 98.4 F (36.9 C) (Oral)  09/08/19 98 F (36.7 C) (Oral)  06/10/18 99 F (37.2 C)   BP Readings from Last 3 Encounters:  12/18/19 130/84  09/11/19 (!) 130/93  09/08/19 114/72   Pulse Readings from Last 3 Encounters:  12/18/19 75  09/08/19 65  06/10/18 77    Physical Exam Vitals and nursing note reviewed.  Constitutional:      Appearance: Normal appearance. She is well-developed and well-groomed. She is obese.  HENT:     Head: Normocephalic and atraumatic.  Eyes:     Conjunctiva/sclera: Conjunctivae normal.     Pupils: Pupils are equal, round, and reactive to light.  Cardiovascular:     Rate and Rhythm: Normal rate and regular rhythm.     Heart sounds: Normal heart sounds. No murmur heard.   Pulmonary:     Effort: Pulmonary effort is normal.     Breath sounds: Normal breath sounds.  Skin:    General: Skin is warm and dry.  Neurological:     General:  No focal deficit present.     Mental Status: She is alert and oriented to person, place, and time. Mental status is at baseline.     Gait: Gait normal.  Psychiatric:        Attention and Perception: Attention and perception normal.        Mood and Affect: Mood and affect normal.  Speech: Speech normal.        Behavior: Behavior normal. Behavior is cooperative.        Thought Content: Thought content normal.        Cognition and Memory: Cognition and memory normal.        Judgment: Judgment normal.     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 Roseanne Reno  DEXA consider age 89    rec healthy diet and exercise   Cards 09/11/19 Dr. Lady Gary ekg stress, holter, echo stress ordered f/u 09/25/19 and 10/09/19; results normal     Neurology appt 09/18/19 Dr. Malvin Johns for h/a initial consult    Tinnitus of both ears - Plan: Ambulatory referral to ENT [facility]   Dizziness - Plan: Ambulatory referral to ENT  Abnormality of gait due to impairment of balance - Plan: Ambulatory referral to ENT F/u neurology   Weakness of both lower extremities - Plan: DG Lumbar Spine Complete F/u Dr. Malvin Johns consider EMG/NCS  Chronic bilateral low back pain, unspecified whether sciatica present - Plan: DG Lumbar Spine Complete  Abdominal aortic aneurysm (AAA) without rupture (HCC) 2.5 cm  [place] repeat due 08/2024  Epigastric abdominal pain ? Pancreatitis/celiac/Hpylori - Plan: Ambulatory referral to Gastroenterology, Celiac Disease Ab Screen w/Rfx, H Pylori, IGM, IGG, IGA AB Consider EGD with GI  See [place] report  cmet     Hypothyroidism, unspecified type - Plan: TSH, Thyroid peroxidase antibody Levo 25 mcg    Iron deficiency - Plan: Iron, TIBC and Ferritin Panel  History of hepatitis C - Plan: Hepatitis A Ab, Total  Vitamin D deficiency - Plan: Vitamin D (25 hydroxy), H Pylori, IGM, IGG, IGA AB  Obesity (BMI 30.0-34.9)  rec healthy diet and exercise   Provider: Dr. French Ana McLean-Scocuzza-Internal Medicine

## 2019-12-18 NOTE — Progress Notes (Signed)
Patient flagged: Current status:  OVERDUE FOR COLORECTAL CANCER SCREENING Current status:  PATIENT IS OVERDUE FOR BMI FOLLOW UP PLAN BMI is estimated to be 31.1 based on the last recorded weight and height

## 2019-12-18 NOTE — Patient Instructions (Addendum)
Food Choices for Gastroesophageal Reflux Disease, Adult When you have gastroesophageal reflux disease (GERD), the foods you eat and your eating habits are very important. Choosing the right foods can help ease the discomfort of GERD. Consider working with a diet and nutrition specialist (dietitian) to help you make healthy food choices. What general guidelines should I follow?  Eating plan  Choose healthy foods low in fat, such as fruits, vegetables, whole grains, low-fat dairy products, and lean meat, fish, and poultry.  Eat frequent, small meals instead of three large meals each day. Eat your meals slowly, in a relaxed setting. Avoid bending over or lying down until 2-3 hours after eating.  Limit high-fat foods such as fatty meats or fried foods.  Limit your intake of oils, butter, and shortening to less than 8 teaspoons each day.  Avoid the following: ? Foods that cause symptoms. These may be different for different people. Keep a food diary to keep track of foods that cause symptoms. ? Alcohol. ? Drinking large amounts of liquid with meals. ? Eating meals during the 2-3 hours before bed.  Cook foods using methods other than frying. This may include baking, grilling, or broiling. Lifestyle  Maintain a healthy weight. Ask your health care provider what weight is healthy for you. If you need to lose weight, work with your health care provider to do so safely.  Exercise for at least 30 minutes on 5 or more days each week, or as told by your health care provider.  Avoid wearing clothes that fit tightly around your waist and chest.  Do not use any products that contain nicotine or tobacco, such as cigarettes and e-cigarettes. If you need help quitting, ask your health care provider.  Sleep with the head of your bed raised. Use a wedge under the mattress or blocks under the bed frame to raise the head of the bed. What foods are not recommended? The items listed may not be a complete  list. Talk with your dietitian about what dietary choices are best for you. Grains Pastries or quick breads with added fat. French toast. Vegetables Deep fried vegetables. French fries. Any vegetables prepared with added fat. Any vegetables that cause symptoms. For some people this may include tomatoes and tomato products, chili peppers, onions and garlic, and horseradish. Fruits Any fruits prepared with added fat. Any fruits that cause symptoms. For some people this may include citrus fruits, such as oranges, grapefruit, pineapple, and lemons. Meats and other protein foods High-fat meats, such as fatty beef or pork, hot dogs, ribs, ham, sausage, salami and bacon. Fried meat or protein, including fried fish and fried chicken. Nuts and nut butters. Dairy Whole milk and chocolate milk. Sour cream. Cream. Ice cream. Cream cheese. Milk shakes. Beverages Coffee and tea, with or without caffeine. Carbonated beverages. Sodas. Energy drinks. Fruit juice made with acidic fruits (such as orange or grapefruit). Tomato juice. Alcoholic drinks. Fats and oils Butter. Margarine. Shortening. Ghee. Sweets and desserts Chocolate and cocoa. Donuts. Seasoning and other foods Pepper. Peppermint and spearmint. Any condiments, herbs, or seasonings that cause symptoms. For some people, this may include curry, hot sauce, or vinegar-based salad dressings. Summary  When you have gastroesophageal reflux disease (GERD), food and lifestyle choices are very important to help ease the discomfort of GERD.  Eat frequent, small meals instead of three large meals each day. Eat your meals slowly, in a relaxed setting. Avoid bending over or lying down until 2-3 hours after eating.  Limit high-fat   foods such as fatty meat or fried foods. This information is not intended to replace advice given to you by your health care provider. Make sure you discuss any questions you have with your health care provider. Document Revised:  09/26/2018 Document Reviewed: 06/06/2016 Elsevier Patient Education  2020 [employer]  Seborrheic Keratosis A seborrheic keratosis is a common, noncancerous (benign) skin growth. These growths are velvety, waxy, rough, tan, brown, or black spots that appear on the skin. These skin growths can be flat or raised, and scaly. What are the causes? The cause of this condition is not known. What increases the risk? You are more likely to develop this condition if you:  Have a family history of seborrheic keratosis.  Are 50 or older.  Are pregnant.  Have had estrogen replacement therapy. What are the signs or symptoms? Symptoms of this condition include growths on the face, chest, shoulders, back, or other areas. These growths:  Are usually painless, but may become irritated and itchy.  Can be yellow, brown, black, or other colors.  Are slightly raised or have a flat surface.  Are sometimes rough or wart-like in texture.  Are often velvety or waxy on the surface.  Are round or oval-shaped.  Often occur in groups, but may occur as a single growth. How is this diagnosed? This condition is diagnosed with a medical history and physical exam.  A sample of the growth may be tested (skin biopsy).  You may need to see a skin specialist (dermatologist). How is this treated? Treatment is not usually needed for this condition, unless the growths are irritated or bleed often.  You may also choose to have the growths removed if you do not like their appearance. ? Most commonly, these growths are treated with a procedure in which liquid nitrogen is applied to "freeze" off the growth (cryosurgery). ? They may also be burned off with electricity (electrocautery) or removed by scraping (curettage). Follow these instructions at home:  Watch your growth for any changes.  Keep all follow-up visits as told by your health care provider. This is important.  Do not scratch or pick at the growth  or growths. This can cause them to become irritated or infected. Contact a health care provider if:  You suddenly have many new growths.  Your growth bleeds, itches, or hurts.  Your growth suddenly becomes larger or changes color. Summary  A seborrheic keratosis is a common, noncancerous (benign) skin growth.  Treatment is not usually needed for this condition, unless the growths are irritated or bleed often.  Watch your growth for any changes.  Contact a health care provider if you suddenly have many new growths or your growth suddenly becomes larger or changes color.  Keep all follow-up visits as told by your health care provider. This is important. This information is not intended to replace advice given to you by your health care provider. Make sure you discuss any questions you have with your health care provider. Document Revised: 10/18/2017 Document Reviewed: 10/18/2017 Elsevier Patient Education  2020 [employer]    Neck Exercises Ask your health care provider which exercises are safe for you. Do exercises exactly as told by your health care provider and adjust them as directed. It is normal to feel mild stretching, pulling, tightness, or discomfort as you do these exercises. Stop right away if you feel sudden pain or your pain gets worse. Do not begin these exercises until told by your health care provider. Neck exercises can  be important for many reasons. They can improve strength and maintain flexibility in your neck, which will help your upper back and prevent neck pain. Stretching exercises Rotation neck stretching  1. Sit in a chair or stand up. 2. Place your feet flat on the floor, shoulder width apart. 3. Slowly turn your head (rotate) to the right until a slight stretch is felt. Turn it all the way to the right so you can look over your right shoulder. Do not tilt or tip your head. 4. Hold this position for 10-30 seconds. 5. Slowly turn your head (rotate) to the  left until a slight stretch is felt. Turn it all the way to the left so you can look over your left shoulder. Do not tilt or tip your head. 6. Hold this position for 10-30 seconds. Repeat __________ times. Complete this exercise __________ times a day. Neck retraction 1. Sit in a sturdy chair or stand up. 2. Look straight ahead. Do not bend your neck. 3. Use your fingers to push your chin backward (retraction). Do not bend your neck for this movement. Continue to face straight ahead. If you are doing the exercise properly, you will feel a slight sensation in your throat and a stretch at the back of your neck. 4. Hold the stretch for 1-2 seconds. Repeat __________ times. Complete this exercise __________ times a day. Strengthening exercises Neck press 1. Lie on your back on a firm bed or on the floor with a pillow under your head. 2. Use your neck muscles to push your head down on the pillow and straighten your spine. 3. Hold the position as well as you can. Keep your head facing up (in a neutral position) and your chin tucked. 4. Slowly count to 5 while holding this position. Repeat __________ times. Complete this exercise __________ times a day. Isometrics These are exercises in which you strengthen the muscles in your neck while keeping your neck still (isometrics). 1. Sit in a supportive chair and place your hand on your forehead. 2. Keep your head and face facing straight ahead. Do not flex or extend your neck while doing isometrics. 3. Push forward with your head and neck while pushing back with your hand. Hold for 10 seconds. 4. Do the sequence again, this time putting your hand against the back of your head. Use your head and neck to push backward against the hand pressure. 5. Finally, do the same exercise on either side of your head, pushing sideways against the pressure of your hand. Repeat __________ times. Complete this exercise __________ times a day. Prone head lifts 1. Lie  face-down (prone position), resting on your elbows so that your chest and upper back are raised. 2. Start with your head facing downward, near your chest. Position your chin either on or near your chest. 3. Slowly lift your head upward. Lift until you are looking straight ahead. Then continue lifting your head as far back as you can comfortably stretch. 4. Hold your head up for 5 seconds. Then slowly lower it to your starting position. Repeat __________ times. Complete this exercise __________ times a day. Supine head lifts 1. Lie on your back (supine position), bending your knees to point to the ceiling and keeping your feet flat on the floor. 2. Lift your head slowly off the floor, raising your chin toward your chest. 3. Hold for 5 seconds. Repeat __________ times. Complete this exercise __________ times a day. Scapular retraction 1. Stand with your arms at  your sides. Look straight ahead. 2. Slowly pull both shoulders (scapulae) backward and downward (retraction) until you feel a stretch between your shoulder blades in your upper back. 3. Hold for 10-30 seconds. 4. Relax and repeat. Repeat __________ times. Complete this exercise __________ times a day. Contact a health care provider if:  Your neck pain or discomfort gets much worse when you do an exercise.  Your neck pain or discomfort does not improve within 2 hours after you exercise. If you have any of these problems, stop exercising right away. Do not do the exercises again unless your health care provider says that you can. Get help right away if:  You develop sudden, severe neck pain. If this happens, stop exercising right away. Do not do the exercises again unless your health care provider says that you can. This information is not intended to replace advice given to you by your health care provider. Make sure you discuss any questions you have with your health care provider. Document Revised: 04/03/2018 Document Reviewed:  04/03/2018 Elsevier Patient Education  2020 [employer]    Back Exercises The following exercises strengthen the muscles that help to support the trunk and back. They also help to keep the lower back flexible. Doing these exercises can help to prevent back pain or lessen existing pain.  If you have back pain or discomfort, try doing these exercises 2-3 times each day or as told by your health care provider.  As your pain improves, do them once each day, but increase the number of times that you repeat the steps for each exercise (do more repetitions).  To prevent the recurrence of back pain, continue to do these exercises once each day or as told by your health care provider. Do exercises exactly as told by your health care provider and adjust them as directed. It is normal to feel mild stretching, pulling, tightness, or discomfort as you do these exercises, but you should stop right away if you feel sudden pain or your pain gets worse. Exercises Single knee to chest Repeat these steps 3-5 times for each leg: 1. Lie on your back on a firm bed or the floor with your legs extended. 2. Bring one knee to your chest. Your other leg should stay extended and in contact with the floor. 3. Hold your knee in place by grabbing your knee or thigh with both hands and hold. 4. Pull on your knee until you feel a gentle stretch in your lower back or buttocks. 5. Hold the stretch for 10-30 seconds. 6. Slowly release and straighten your leg. Pelvic tilt Repeat these steps 5-10 times: 1. Lie on your back on a firm bed or the floor with your legs extended. 2. Bend your knees so they are pointing toward the ceiling and your feet are flat on the floor. 3. Tighten your lower abdominal muscles to press your lower back against the floor. This motion will tilt your pelvis so your tailbone points up toward the ceiling instead of pointing to your feet or the floor. 4. With gentle tension and even breathing, hold  this position for 5-10 seconds. Cat-cow Repeat these steps until your lower back becomes more flexible: 1. Get into a hands-and-knees position on a firm surface. Keep your hands under your shoulders, and keep your knees under your hips. You may place padding under your knees for comfort. 2. Let your head hang down toward your chest. Contract your abdominal muscles and point your tailbone toward the floor  so your lower back becomes rounded like the back of a cat. 3. Hold this position for 5 seconds. 4. Slowly lift your head, let your abdominal muscles relax and point your tailbone up toward the ceiling so your back forms a sagging arch like the back of a cow. 5. Hold this position for 5 seconds.  Press-ups Repeat these steps 5-10 times: 1. Lie on your abdomen (face-down) on the floor. 2. Place your palms near your head, about shoulder-width apart. 3. Keeping your back as relaxed as possible and keeping your hips on the floor, slowly straighten your arms to raise the top half of your body and lift your shoulders. Do not use your back muscles to raise your upper torso. You may adjust the placement of your hands to make yourself more comfortable. 4. Hold this position for 5 seconds while you keep your back relaxed. 5. Slowly return to lying flat on the floor.  Bridges Repeat these steps 10 times: 1. Lie on your back on a firm surface. 2. Bend your knees so they are pointing toward the ceiling and your feet are flat on the floor. Your arms should be flat at your sides, next to your body. 3. Tighten your buttocks muscles and lift your buttocks off the floor until your waist is at almost the same height as your knees. You should feel the muscles working in your buttocks and the back of your thighs. If you do not feel these muscles, slide your feet 1-2 inches farther away from your buttocks. 4. Hold this position for 3-5 seconds. 5. Slowly lower your hips to the starting position, and allow your  buttocks muscles to relax completely. If this exercise is too easy, try doing it with your arms crossed over your chest. Abdominal crunches Repeat these steps 5-10 times: 1. Lie on your back on a firm bed or the floor with your legs extended. 2. Bend your knees so they are pointing toward the ceiling and your feet are flat on the floor. 3. Cross your arms over your chest. 4. Tip your chin slightly toward your chest without bending your neck. 5. Tighten your abdominal muscles and slowly raise your trunk (torso) high enough to lift your shoulder blades a tiny bit off the floor. Avoid raising your torso higher than that because it can put too much stress on your low back and does not help to strengthen your abdominal muscles. 6. Slowly return to your starting position. Back lifts Repeat these steps 5-10 times: 1. Lie on your abdomen (face-down) with your arms at your sides, and rest your forehead on the floor. 2. Tighten the muscles in your legs and your buttocks. 3. Slowly lift your chest off the floor while you keep your hips pressed to the floor. Keep the back of your head in line with the curve in your back. Your eyes should be looking at the floor. 4. Hold this position for 3-5 seconds. 5. Slowly return to your starting position. Contact a health care provider if:  Your back pain or discomfort gets much worse when you do an exercise.  Your worsening back pain or discomfort does not lessen within 2 hours after you exercise. If you have any of these problems, stop doing these exercises right away. Do not do them again unless your health care provider says that you can. Get help right away if:  You develop sudden, severe back pain. If this happens, stop doing the exercises right away. Do not do them  again unless your health care provider says that you can. This information is not intended to replace advice given to you by your health care provider. Make sure you discuss any questions you  have with your health care provider. Document Revised: 10/10/2018 Document Reviewed: 03/07/2018 Elsevier Patient Education  2020 [employer].

## 2019-12-19 ENCOUNTER — Other Ambulatory Visit: Payer: Self-pay

## 2019-12-19 DIAGNOSIS — I714 Abdominal aortic aneurysm, without rupture, unspecified: Secondary | ICD-10-CM | POA: Insufficient documentation

## 2019-12-19 DIAGNOSIS — E669 Obesity, unspecified: Secondary | ICD-10-CM | POA: Insufficient documentation

## 2019-12-19 DIAGNOSIS — R2689 Other abnormalities of gait and mobility: Secondary | ICD-10-CM | POA: Insufficient documentation

## 2019-12-25 ENCOUNTER — Other Ambulatory Visit: Payer: Self-pay

## 2019-12-25 ENCOUNTER — Ambulatory Visit (INDEPENDENT_AMBULATORY_CARE_PROVIDER_SITE_OTHER): Payer: 59

## 2019-12-25 ENCOUNTER — Other Ambulatory Visit (INDEPENDENT_AMBULATORY_CARE_PROVIDER_SITE_OTHER): Payer: 59

## 2019-12-25 DIAGNOSIS — Z1159 Encounter for screening for other viral diseases: Secondary | ICD-10-CM

## 2019-12-25 DIAGNOSIS — R1013 Epigastric pain: Secondary | ICD-10-CM | POA: Diagnosis not present

## 2019-12-25 DIAGNOSIS — Z13220 Encounter for screening for lipoid disorders: Secondary | ICD-10-CM

## 2019-12-25 DIAGNOSIS — K9041 Non-celiac gluten sensitivity: Secondary | ICD-10-CM | POA: Diagnosis not present

## 2019-12-25 DIAGNOSIS — E559 Vitamin D deficiency, unspecified: Secondary | ICD-10-CM

## 2019-12-25 DIAGNOSIS — E611 Iron deficiency: Secondary | ICD-10-CM | POA: Diagnosis not present

## 2019-12-25 DIAGNOSIS — G8929 Other chronic pain: Secondary | ICD-10-CM | POA: Diagnosis not present

## 2019-12-25 DIAGNOSIS — E039 Hypothyroidism, unspecified: Secondary | ICD-10-CM

## 2019-12-25 DIAGNOSIS — R29898 Other symptoms and signs involving the musculoskeletal system: Secondary | ICD-10-CM

## 2019-12-25 DIAGNOSIS — M545 Low back pain: Secondary | ICD-10-CM | POA: Diagnosis not present

## 2019-12-25 DIAGNOSIS — Z0000 Encounter for general adult medical examination without abnormal findings: Secondary | ICD-10-CM

## 2019-12-25 DIAGNOSIS — Z8619 Personal history of other infectious and parasitic diseases: Secondary | ICD-10-CM

## 2019-12-25 DIAGNOSIS — R748 Abnormal levels of other serum enzymes: Secondary | ICD-10-CM | POA: Diagnosis not present

## 2019-12-25 DIAGNOSIS — Z0184 Encounter for antibody response examination: Secondary | ICD-10-CM

## 2019-12-25 LAB — COMPREHENSIVE METABOLIC PANEL
ALT: 14 U/L (ref 0–35)
AST: 13 U/L (ref 0–37)
Albumin: 4.6 g/dL (ref 3.5–5.2)
Alkaline Phosphatase: 40 U/L (ref 39–117)
BUN: 13 mg/dL (ref 6–23)
CO2: 29 mEq/L (ref 19–32)
Calcium: 9.5 mg/dL (ref 8.4–10.5)
Chloride: 104 mEq/L (ref 96–112)
Creatinine, Ser: 0.67 mg/dL (ref 0.40–1.20)
GFR: 90.51 mL/min (ref >60.00)
Glucose, Bld: 95 mg/dL (ref 70–99)
Potassium: 4.1 mEq/L (ref 3.5–5.1)
Sodium: 140 mEq/L (ref 135–145)
Total Bilirubin: 0.4 mg/dL (ref 0.2–1.2)
Total Protein: 6.6 g/dL (ref 6.0–8.3)

## 2019-12-25 LAB — LIPID PANEL
Cholesterol: 236 mg/dL — ABNORMAL HIGH (ref 0–200)
HDL: 58.8 mg/dL (ref >39.00)
LDL Cholesterol: 144 mg/dL — ABNORMAL HIGH (ref 0–99)
NonHDL: 177.11
Total CHOL/HDL Ratio: 4
Triglycerides: 164 mg/dL — ABNORMAL HIGH (ref 0.0–149.0)
VLDL: 32.8 mg/dL (ref 0.0–40.0)

## 2019-12-25 LAB — LIPASE: Lipase: 46 U/L (ref 11.0–59.0)

## 2019-12-25 LAB — TSH: TSH: 4.55 u[IU]/mL — ABNORMAL HIGH (ref 0.35–4.50)

## 2019-12-25 LAB — VITAMIN D 25 HYDROXY (VIT D DEFICIENCY, FRACTURES): VITD: 23.02 ng/mL — ABNORMAL LOW (ref 30.00–100.00)

## 2019-12-25 LAB — AMYLASE: Amylase: 36 U/L (ref 27–131)

## 2019-12-26 LAB — THYROID PEROXIDASE ANTIBODY: Thyroperoxidase Ab SerPl-aCnc: 2 IU/mL (ref <9)

## 2019-12-26 LAB — IRON,TIBC AND FERRITIN PANEL
%SAT: 24 % (calc) (ref 16–45)
Ferritin: 37 ng/mL (ref 16–232)
Iron: 81 ug/dL (ref 45–160)
TIBC: 331 mcg/dL (calc) (ref 250–450)

## 2019-12-26 LAB — HEPATITIS A ANTIBODY, TOTAL: Hepatitis A AB,Total: NONREACTIVE

## 2019-12-27 LAB — H PYLORI, IGM, IGG, IGA AB
H pylori, IgM Abs: <9 units (ref 0.0–8.9)
H. pylori, IgA Abs: <9 units (ref 0.0–8.9)
H. pylori, IgG AbS: 0.3 Index Value (ref 0.00–0.79)

## 2019-12-27 LAB — CELIAC DISEASE AB SCREEN W/RFX
Antigliadin Abs, IgA: 3 units (ref 0–19)
IgA/Immunoglobulin A, Serum: 72 mg/dL — ABNORMAL LOW (ref 87–352)
Transglutaminase IgA: <2 U/mL (ref 0–3)

## 2019-12-29 ENCOUNTER — Other Ambulatory Visit: Payer: Self-pay | Admitting: Internal Medicine

## 2019-12-29 ENCOUNTER — Encounter: Payer: Self-pay | Admitting: Internal Medicine

## 2019-12-29 DIAGNOSIS — E559 Vitamin D deficiency, unspecified: Secondary | ICD-10-CM | POA: Insufficient documentation

## 2019-12-29 DIAGNOSIS — I672 Cerebral atherosclerosis: Secondary | ICD-10-CM | POA: Insufficient documentation

## 2019-12-29 DIAGNOSIS — E039 Hypothyroidism, unspecified: Secondary | ICD-10-CM

## 2019-12-29 MED ORDER — LEVOTHYROXINE SODIUM 25 MCG PO TABS: 37.5000 ug | ORAL_TABLET | Freq: Every day | ORAL | 3 refills | Status: DC

## 2019-12-30 ENCOUNTER — Telehealth: Payer: Self-pay | Admitting: Internal Medicine

## 2019-12-30 NOTE — Telephone Encounter (Signed)
Patient called and left 2nd VM on 12/25/19 " [facility] said about 6 hours ago  "Rejection Reason - Patient did not respond" [facility] said about 6 hours ago  I spoke with pt on 12/30/2019 she stated she was going to call and schedule.

## 2019-12-31 ENCOUNTER — Telehealth: Payer: Self-pay | Admitting: Internal Medicine

## 2019-12-31 NOTE — Telephone Encounter (Signed)
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

## 2019-12-31 NOTE — Telephone Encounter (Signed)
Left message to return call 

## 2019-12-31 NOTE — Telephone Encounter (Signed)
-----   Message from Bevelyn Buckles, MD sent at 12/29/2019  9:20 AM EDT ----- No H yplori   IgA slightly low which could have role in diarrhea  -is she having diarrhea if so would rec see GI? Referral already in Dr. Norma Fredrickson   Liver kidneys normal  Pancreas enzymes normal now   Cholesterol elevated but improved  -I would rec low dose statin cholesterol medication is she agreeable?   Vitamin D low would rec D3 4000 to 5000 IU daily otc  Iron normal   Thyroid still elevated slightly 25 mcg to 37.5 and sent to pharmacy  -will increase thyroid medication   Consider hep A vaccine x 2 doses now and then in 6 months to 1 year

## 2020-01-01 DIAGNOSIS — F349 Persistent mood [affective] disorder, unspecified: Secondary | ICD-10-CM | POA: Diagnosis not present

## 2020-01-07 ENCOUNTER — Telehealth: Payer: Self-pay | Admitting: Internal Medicine

## 2020-01-07 NOTE — Telephone Encounter (Signed)
Called patient again today and left 3rd and final VM for patient to contact our office if she would like to setup an apt. [facility] 01/01/20  and I spoke with pt on 12/30/2019 she stated she was going to call to sch.   [place] [facility] Dr Willeen Niece

## 2020-01-08 DIAGNOSIS — H903 Sensorineural hearing loss, bilateral: Secondary | ICD-10-CM | POA: Diagnosis not present

## 2020-01-08 DIAGNOSIS — H9319 Tinnitus, unspecified ear: Secondary | ICD-10-CM | POA: Diagnosis not present

## 2020-01-08 DIAGNOSIS — R42 Dizziness and giddiness: Secondary | ICD-10-CM | POA: Diagnosis not present

## 2020-01-11 ENCOUNTER — Encounter: Payer: Self-pay | Admitting: Internal Medicine

## 2020-01-11 DIAGNOSIS — R42 Dizziness and giddiness: Secondary | ICD-10-CM | POA: Insufficient documentation

## 2020-01-11 DIAGNOSIS — H9319 Tinnitus, unspecified ear: Secondary | ICD-10-CM | POA: Insufficient documentation

## 2020-01-12 ENCOUNTER — Telehealth: Payer: Self-pay | Admitting: Internal Medicine

## 2020-01-12 NOTE — Telephone Encounter (Signed)
lft vm for pt to  Call ofc regarding referral.

## 2020-01-22 DIAGNOSIS — F349 Persistent mood [affective] disorder, unspecified: Secondary | ICD-10-CM | POA: Diagnosis not present

## 2020-02-09 DIAGNOSIS — R1013 Epigastric pain: Secondary | ICD-10-CM | POA: Diagnosis not present

## 2020-02-09 DIAGNOSIS — R198 Other specified symptoms and signs involving the digestive system and abdomen: Secondary | ICD-10-CM | POA: Diagnosis not present

## 2020-02-09 DIAGNOSIS — Z1211 Encounter for screening for malignant neoplasm of colon: Secondary | ICD-10-CM | POA: Diagnosis not present

## 2020-02-26 DIAGNOSIS — F349 Persistent mood [affective] disorder, unspecified: Secondary | ICD-10-CM | POA: Diagnosis not present

## 2020-02-27 ENCOUNTER — Other Ambulatory Visit
Admission: RE | Admit: 2020-02-27 | Discharge: 2020-02-27 | Disposition: A | Discharge status: Home / Self Care | Payer: 59 | Source: Ambulatory Visit | Attending: Gastroenterology | Admitting: Gastroenterology

## 2020-02-27 ENCOUNTER — Other Ambulatory Visit: Payer: Self-pay

## 2020-02-27 DIAGNOSIS — Z01812 Encounter for preprocedural laboratory examination: Secondary | ICD-10-CM | POA: Diagnosis not present

## 2020-02-27 DIAGNOSIS — Z20822 Contact with and (suspected) exposure to covid-19: Secondary | ICD-10-CM | POA: Insufficient documentation

## 2020-02-28 LAB — SARS CORONAVIRUS 2 (TAT 6-24 HRS): SARS Coronavirus 2: NEGATIVE

## 2020-03-01 ENCOUNTER — Encounter: Payer: Self-pay | Admitting: *Deleted

## 2020-03-02 ENCOUNTER — Ambulatory Visit: Payer: 59 | Admitting: Anesthesiology

## 2020-03-02 ENCOUNTER — Encounter: Payer: Self-pay | Admitting: Anesthesiology

## 2020-03-02 ENCOUNTER — Other Ambulatory Visit: Payer: Self-pay

## 2020-03-02 ENCOUNTER — Ambulatory Visit
Admission: RE | Admit: 2020-03-02 | Discharge: 2020-03-02 | Disposition: A | Discharge status: Home / Self Care | Payer: 59 | Attending: Gastroenterology | Admitting: Gastroenterology

## 2020-03-02 ENCOUNTER — Encounter
Admission: RE | Disposition: A | Discharge status: Home / Self Care | Payer: Self-pay | Source: Home / Self Care | Attending: Gastroenterology

## 2020-03-02 DIAGNOSIS — R1013 Epigastric pain: Secondary | ICD-10-CM | POA: Diagnosis not present

## 2020-03-02 DIAGNOSIS — K219 Gastro-esophageal reflux disease without esophagitis: Secondary | ICD-10-CM | POA: Insufficient documentation

## 2020-03-02 DIAGNOSIS — E039 Hypothyroidism, unspecified: Secondary | ICD-10-CM | POA: Diagnosis not present

## 2020-03-02 DIAGNOSIS — F419 Anxiety disorder, unspecified: Secondary | ICD-10-CM | POA: Diagnosis not present

## 2020-03-02 DIAGNOSIS — K529 Noninfective gastroenteritis and colitis, unspecified: Secondary | ICD-10-CM | POA: Insufficient documentation

## 2020-03-02 DIAGNOSIS — Z79890 Hormone replacement therapy (postmenopausal): Secondary | ICD-10-CM | POA: Diagnosis not present

## 2020-03-02 DIAGNOSIS — Z79899 Other long term (current) drug therapy: Secondary | ICD-10-CM | POA: Insufficient documentation

## 2020-03-02 DIAGNOSIS — I10 Essential (primary) hypertension: Secondary | ICD-10-CM | POA: Insufficient documentation

## 2020-03-02 DIAGNOSIS — K2930 Chronic superficial gastritis without bleeding: Secondary | ICD-10-CM | POA: Diagnosis not present

## 2020-03-02 DIAGNOSIS — K2950 Unspecified chronic gastritis without bleeding: Secondary | ICD-10-CM | POA: Diagnosis not present

## 2020-03-02 DIAGNOSIS — K633 Ulcer of intestine: Secondary | ICD-10-CM | POA: Diagnosis not present

## 2020-03-02 DIAGNOSIS — R194 Change in bowel habit: Secondary | ICD-10-CM | POA: Diagnosis not present

## 2020-03-02 DIAGNOSIS — Z1211 Encounter for screening for malignant neoplasm of colon: Secondary | ICD-10-CM | POA: Diagnosis not present

## 2020-03-02 DIAGNOSIS — F329 Major depressive disorder, single episode, unspecified: Secondary | ICD-10-CM | POA: Diagnosis not present

## 2020-03-02 DIAGNOSIS — K640 First degree hemorrhoids: Secondary | ICD-10-CM | POA: Diagnosis not present

## 2020-03-02 DIAGNOSIS — K5289 Other specified noninfective gastroenteritis and colitis: Secondary | ICD-10-CM | POA: Diagnosis not present

## 2020-03-02 HISTORY — DX: Essential (primary) hypertension: I10

## 2020-03-02 HISTORY — PX: COLONOSCOPY WITH PROPOFOL: SHX5780

## 2020-03-02 HISTORY — PX: ESOPHAGOGASTRODUODENOSCOPY (EGD) WITH PROPOFOL: SHX5813

## 2020-03-02 SURGERY — COLONOSCOPY WITH PROPOFOL
Anesthesia: General

## 2020-03-02 MED ORDER — SODIUM CHLORIDE 0.9 % IV SOLN: INTRAVENOUS | Status: DC

## 2020-03-02 MED ORDER — PROPOFOL 500 MG/50ML IV EMUL
INTRAVENOUS | Status: AC
  Filled 2020-03-02: qty 50

## 2020-03-02 MED ORDER — EPHEDRINE SULFATE 50 MG/ML IJ SOLN
INTRAMUSCULAR | Status: DC | PRN
  Administered 2020-03-02: 10 mg via INTRAVENOUS

## 2020-03-02 MED ORDER — PROPOFOL 500 MG/50ML IV EMUL
INTRAVENOUS | Status: DC | PRN
  Administered 2020-03-02: 125 ug/kg/min via INTRAVENOUS

## 2020-03-02 MED ORDER — LIDOCAINE HCL (CARDIAC) PF 100 MG/5ML IV SOSY
PREFILLED_SYRINGE | INTRAVENOUS | Status: DC | PRN
  Administered 2020-03-02: 50 mg via INTRAVENOUS

## 2020-03-02 NOTE — Interval H&P Note (Signed)
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  The various methods of treatment have been discussed with the patient and family. After consideration of risks, benefits and other options for treatment, the patient has consented to  Procedure(s): COLONOSCOPY WITH PROPOFOL (N/A) ESOPHAGOGASTRODUODENOSCOPY (EGD) WITH PROPOFOL (N/A) as a surgical intervention.  The patient's history has been reviewed, patient examined, no change in status, stable for surgery.  I have reviewed the patient's chart and labs.  Questions were answered to the patient's satisfaction.     Regis Bill  Ok to proceed with EGD/Colonoscopy

## 2020-03-02 NOTE — Op Note (Signed)
[facility] Gastroenterology Patient Name: Michele Hammond Procedure Date: 03/02/2020 1:59 PM MRN: [id number] Account #: [id number] Date of Birth: 02/02/1961 Admit Type: Outpatient Age: 59 Room: [facility] ENDO ROOM 3 Gender: Female Note Status: Finalized Procedure:             Upper GI endoscopy Indications:           Epigastric abdominal pain, Gastro-esophageal reflux                         disease Providers:             Andrey Farmer MD, MD Referring MD:          Nino Glow Mclean-Scocuzza MD, MD (Referring MD) Medicines:             Monitored Anesthesia Care Complications:         No immediate complications. Estimated blood loss:                         Minimal. Procedure:             Pre-Anesthesia Assessment:                        - Prior to the procedure, a History and Physical was                         performed, and patient medications and allergies were                         reviewed. The patient is competent. The risks and                         benefits of the procedure and the sedation options and                         risks were discussed with the patient. All questions                         were answered and informed consent was obtained.                         Patient identification and proposed procedure were                         verified by the physician, the nurse, the anesthetist                         and the technician in the endoscopy suite. Mental                         Status Examination: alert and oriented. Airway                         Examination: normal oropharyngeal airway and neck                         mobility. Respiratory Examination: clear to  auscultation. CV Examination: normal. Prophylactic                         Antibiotics: The patient does not require prophylactic                         antibiotics. Prior Anticoagulants: The patient has                         taken no previous anticoagulant  or antiplatelet                         agents. ASA Grade Assessment: II - A patient with mild                         systemic disease. After reviewing the risks and                         benefits, the patient was deemed in satisfactory                         condition to undergo the procedure. The anesthesia                         plan was to use monitored anesthesia care (MAC).                         Immediately prior to administration of medications,                         the patient was re-assessed for adequacy to receive                         sedatives. The heart rate, respiratory rate, oxygen                         saturations, blood pressure, adequacy of pulmonary                         ventilation, and response to care were monitored                         throughout the procedure. The physical status of the                         patient was re-assessed after the procedure.                        After obtaining informed consent, the endoscope was                         passed under direct vision. Throughout the procedure,                         the patient's blood pressure, pulse, and oxygen                         saturations were monitored continuously. The Endoscope  was introduced through the mouth, and advanced to the                         second part of duodenum. The upper GI endoscopy was                         accomplished without difficulty. The patient tolerated                         the procedure well. Findings:      The examined esophagus was normal.      Patchy mildly erythematous mucosa without bleeding was found in the       gastric antrum. Biopsies were taken with a cold forceps for Helicobacter       pylori testing. Estimated blood loss was minimal.      The examined duodenum was normal. Biopsies for histology were taken with       a cold forceps for evaluation of celiac disease. Estimated blood loss       was  minimal.      The exam of the duodenum was otherwise normal. Impression:            - Normal esophagus.                        - Erythematous mucosa in the antrum. Biopsied.                        - Normal examined duodenum. Biopsied. Recommendation:        - Discharge patient to home.                        - Resume previous diet.                        - Continue present medications.                        - Await pathology results.                        - Return to referring physician as previously                         scheduled. Procedure Code(s):     --- Professional ---                        [id number], Esophagogastroduodenoscopy, flexible,                         transoral; with biopsy, single or multiple Diagnosis Code(s):     --- Professional ---                        K31.89, Other diseases of stomach and duodenum                        R10.13, Epigastric pain                        K21.9, Gastro-esophageal reflux disease without  esophagitis CPT copyright 2019 American [employer] Association. All rights reserved. The codes documented in this report are preliminary and upon coder review may  be revised to meet current compliance requirements. Andrey Farmer, MD Andrey Farmer MD, MD 03/02/2020 2:41:53 PM Number of Addenda: 0 Note Initiated On: 03/02/2020 1:59 PM Estimated Blood Loss:  Estimated blood loss was minimal.      [facility]

## 2020-03-02 NOTE — Op Note (Signed)
[facility] Gastroenterology Patient Name: Michele Hammond Procedure Date: 03/02/2020 1:59 PM MRN: [id number] Account #: [id number] Date of Birth: June 26, 1960 Admit Type: Outpatient Age: 59 Room: [facility] ENDO ROOM 3 Gender: Female Note Status: Finalized Procedure:             Colonoscopy Indications:           Screening for colorectal malignant neoplasm Providers:             Andrey Farmer MD, MD Referring MD:          Nino Glow Mclean-Scocuzza MD, MD (Referring MD) Medicines:             Monitored Anesthesia Care Complications:         No immediate complications. Estimated blood loss:                         Minimal. Procedure:             Pre-Anesthesia Assessment:                        - Prior to the procedure, a History and Physical was                         performed, and patient medications and allergies were                         reviewed. The patient is competent. The risks and                         benefits of the procedure and the sedation options and                         risks were discussed with the patient. All questions                         were answered and informed consent was obtained.                         Patient identification and proposed procedure were                         verified by the physician, the nurse, the anesthetist                         and the technician in the endoscopy suite. Mental                         Status Examination: alert and oriented. Airway                         Examination: normal oropharyngeal airway and neck                         mobility. Respiratory Examination: clear to                         auscultation. CV Examination: normal. Prophylactic  Antibiotics: The patient does not require prophylactic                         antibiotics. Prior Anticoagulants: The patient has                         taken no previous anticoagulant or antiplatelet                          agents. ASA Grade Assessment: II - A patient with mild                         systemic disease. After reviewing the risks and                         benefits, the patient was deemed in satisfactory                         condition to undergo the procedure. The anesthesia                         plan was to use monitored anesthesia care (MAC).                         Immediately prior to administration of medications,                         the patient was re-assessed for adequacy to receive                         sedatives. The heart rate, respiratory rate, oxygen                         saturations, blood pressure, adequacy of pulmonary                         ventilation, and response to care were monitored                         throughout the procedure. The physical status of the                         patient was re-assessed after the procedure.                        After obtaining informed consent, the colonoscope was                         passed under direct vision. Throughout the procedure,                         the patient's blood pressure, pulse, and oxygen                         saturations were monitored continuously. The                         Colonoscope was introduced through the anus and  advanced to the the terminal ileum. The colonoscopy                         was performed without difficulty. The patient                         tolerated the procedure well. The quality of the bowel                         preparation was good. Findings:      The terminal ileum contained two small one mm ulcers. No bleeding was       present. No stigmata of recent bleeding were seen. Biopsies were taken       with a cold forceps for histology. Estimated blood loss was minimal.      Non-bleeding internal hemorrhoids were found during retroflexion. The       hemorrhoids were Grade I (internal hemorrhoids that do not prolapse).      The exam was  otherwise without abnormality on direct and retroflexion       views. Impression:            - Two small ulcers in the terminal ileum. Biopsied.                        - Non-bleeding internal hemorrhoids.                        - The examination was otherwise normal on direct and                         retroflexion views. Recommendation:        - Discharge patient to home.                        - Resume previous diet.                        - Continue present medications.                        - Await pathology results.                        - Repeat colonoscopy for surveillance based on                         pathology results.                        - Return to referring physician as previously                         scheduled. Procedure Code(s):     --- Professional ---                        [phone], Colonoscopy, flexible; with biopsy, single or                         multiple Diagnosis Code(s):     --- Professional ---  Z12.11, Encounter for screening for malignant neoplasm                         of colon                        K63.3, Ulcer of intestine                        K64.0, First degree hemorrhoids CPT copyright 2019 American Medical Association. All rights reserved. The codes documented in this report are preliminary and upon coder review may  be revised to meet current compliance requirements. Andrey Farmer, MD Andrey Farmer MD, MD 03/02/2020 2:45:20 PM Number of Addenda: 0 Note Initiated On: 03/02/2020 1:59 PM Scope Withdrawal Time: 0 hours 8 minutes 57 seconds  Total Procedure Duration: 0 hours 12 minutes 14 seconds  Estimated Blood Loss:  Estimated blood loss was minimal.      [facility]

## 2020-03-02 NOTE — Anesthesia Preprocedure Evaluation (Addendum)
Anesthesia Evaluation  Patient identified by MRN, date of birth, ID band Patient awake    Reviewed: Allergy & Precautions, H&P , NPO status , Patient's Chart, lab work & pertinent test results  History of Anesthesia Complications Negative for: history of anesthetic complications  Airway Mallampati: II  TM Distance: >3 FB     Dental  (+) Teeth Intact   Pulmonary neg pulmonary ROS, neg sleep apnea, neg COPD,    breath sounds clear to auscultation       Cardiovascular hypertension, (-) angina(-) Past MI and (-) Cardiac Stents (-) dysrhythmias  Rhythm:regular Rate:Normal     Neuro/Psych  Headaches, PSYCHIATRIC DISORDERS Anxiety Depression    GI/Hepatic GERD  Controlled,(+) Hepatitis - (treated), C  Endo/Other  Hypothyroidism   Renal/GU negative Renal ROS  negative genitourinary   Musculoskeletal   Abdominal   Peds  Hematology negative hematology ROS (+)   Anesthesia Other Findings Past Medical History: No date: Allergy No date: Depression     Comment:  Followed By Dr. Sallyanne Kuster - [facility] No date: Dysrhythmia No date: Frequent headaches No date: Heart murmur No date: Hepatitis     Comment:  C-pt took treatment 2015 and states does not have Hep C               now (02-10-15) No date: Herpes genitalis in women No date: Hypertension  Past Surgical History: 2005: BREAST BIOPSY; Left     Comment:  benign 1990, 1996: CESAREAN SECTION 02/16/2015: CHOLECYSTECTOMY; N/A     Comment:  Procedure: LAPAROSCOPIC CHOLECYSTECTOMY;  Surgeon:               Ida Rogue, MD;  Location: [facility] ORS;  Service:              General;  Laterality: N/A; 1993: SEPTOPLASTY     Comment:  Deviated Septum No date: WRIST FRACTURE SURGERY     Reproductive/Obstetrics negative OB ROS                            Anesthesia Physical Anesthesia Plan  ASA: II  Anesthesia Plan: General   Post-op Pain  Management:    Induction:   PONV Risk Score and Plan: Propofol infusion and TIVA  Airway Management Planned: Nasal Cannula  Additional Equipment:   Intra-op Plan:   Post-operative Plan:   Informed Consent: I have reviewed the patients History and Physical, chart, labs and discussed the procedure including the risks, benefits and alternatives for the proposed anesthesia with the patient or authorized representative who has indicated his/her understanding and acceptance.     Dental Advisory Given  Plan Discussed with: Anesthesiologist, CRNA and Surgeon  Anesthesia Plan Comments:         Anesthesia Quick Evaluation

## 2020-03-02 NOTE — Anesthesia Procedure Notes (Signed)
Performed by: Tonia Ghent Pre-anesthesia Checklist: Patient identified, Emergency Drugs available, Suction available, Patient being monitored and Timeout performed Patient Re-evaluated:Patient Re-evaluated prior to induction Oxygen Delivery Method: Nasal cannula Induction Type: IV induction Airway Equipment and Method: Bite block Placement Confirmation: positive ETCO2 and CO2 detector

## 2020-03-02 NOTE — H&P (Signed)
Outpatient short stay form Pre-procedure 03/02/2020 2:02 PM Merlyn Lot MD, MPH  Primary Physician: Dr. Judie Grieve  Reason for visit:  IBS symptoms  History of present illness:   59 y/o lady with IBS like symptoms here for EGD/Colonoscopy. Also possible intolerance to wheat. Last colonoscopy in 2011 that was unremarkable. No family history of GI malignancies.    Current Facility-Administered Medications:  .  0.9 %  sodium chloride infusion, , Intravenous, Continuous, Ayrabella Labombard, Rossie Muskrat, MD, Last Rate: 20 mL/hr at 03/02/20 1401, Continued from Pre-op at 03/02/20 1401  Medications Prior to Admission  Medication Sig Dispense Refill Last Dose  . cetirizine-pseudoephedrine (ZYRTEC-D) 5-120 MG tablet Take 1 tablet by mouth 2 (two) times daily.     Marland Kitchen loratadine (CLARITIN) 10 MG tablet Take 10 mg by mouth daily as needed for allergies.     . metoprolol succinate (TOPROL-XL) 25 MG 24 hr tablet Take 1 tablet (25 mg total) by mouth at bedtime. 90 tablet 3 03/01/2020 at Unknown time  . buPROPion (WELLBUTRIN XL) 150 MG 24 hr tablet Take 450 mg by mouth every morning.      . gabapentin (NEURONTIN) 300 MG capsule Take 300 mg by mouth 2 (two) times daily.     Marland Kitchen levothyroxine (SYNTHROID) 25 MCG tablet Take 1.5 tablets (37.5 mcg total) by mouth daily before breakfast. 30 min before food 140 tablet 3   . Multiple Vitamin (MULTIVITAMIN) capsule Take 1 capsule by mouth daily.     . ondansetron (ZOFRAN ODT) 4 MG disintegrating tablet Take 1 tablet (4 mg total) by mouth every 8 (eight) hours as needed for nausea or vomiting. (Patient not taking: Reported on 12/18/2019) 20 tablet 0   . pantoprazole (PROTONIX) 20 MG tablet Take 1 tablet (20 mg total) by mouth daily. 30 min before food 90 tablet 0   . sertraline (ZOLOFT) 25 MG tablet 100 mg.   0   . triamcinolone (NASACORT ALLERGY 24HR) 55 MCG/ACT AERO nasal inhaler Place 2 sprays into the nose daily.     Marland Kitchen VYVANSE 30 MG capsule  (Patient not taking:  Reported on 12/18/2019)  0   . zolpidem (AMBIEN) 10 MG tablet Take 10 mg by mouth at bedtime as needed for sleep. (Patient not taking: Reported on 12/18/2019)        Allergies  Allergen Reactions  . Amoxicillin Rash  . Penicillin G Rash  . Sulfa Antibiotics Rash     Past Medical History:  Diagnosis Date  . Allergy   . Depression    Followed By Dr. Sallyanne Kuster [facility]  . Dysrhythmia   . Frequent headaches   . Heart murmur   . Hepatitis    C-pt took treatment 2015 and states does not have Hep C now (02-10-15)  . Herpes genitalis in women   . Hypertension     Review of systems:  Otherwise negative.    Physical Exam  Gen: Alert, oriented. Appears stated age.  HEENT: [place]/AT. PERRLA. Lungs: No respiratory distress Abd: soft, benign, no masses.  Ext: No edema.     Planned procedures: Proceed with EGD/colonoscopy. The patient understands the nature of the planned procedure, indications, risks, alternatives and potential complications including but not limited to bleeding, infection, perforation, damage to internal organs and possible oversedation/side effects from anesthesia. The patient agrees and gives consent to proceed.  Please refer to procedure notes for findings, recommendations and patient disposition/instructions.     Merlyn Lot MD, MPH Gastroenterology 03/02/2020  2:02 PM

## 2020-03-02 NOTE — Anesthesia Postprocedure Evaluation (Signed)
Anesthesia Post Note  Patient: Michele Hammond  Procedure(s) Performed: COLONOSCOPY WITH PROPOFOL (N/A ) ESOPHAGOGASTRODUODENOSCOPY (EGD) WITH PROPOFOL (N/A )  Patient location during evaluation: Endoscopy Anesthesia Type: General Level of consciousness: awake and alert and oriented Pain management: pain level controlled Vital Signs Assessment: post-procedure vital signs reviewed and stable Respiratory status: spontaneous breathing, nonlabored ventilation and respiratory function stable Cardiovascular status: blood pressure returned to baseline and stable Postop Assessment: no signs of nausea or vomiting Anesthetic complications: no   No complications documented.   Last Vitals:  Vitals:   03/02/20 1452 03/02/20 1502  BP: 93/61 102/63  Pulse: 70 65  Resp: (!) 21 14  Temp:    SpO2: 100% 100%    Last Pain:  Vitals:   03/02/20 1502  TempSrc:   PainSc: 0-No pain                 [name]

## 2020-03-02 NOTE — Transfer of Care (Signed)
Immediate Anesthesia Transfer of Care Note  Patient: Michele Hammond  Procedure(s) Performed: COLONOSCOPY WITH PROPOFOL (N/A ) ESOPHAGOGASTRODUODENOSCOPY (EGD) WITH PROPOFOL (N/A )  Patient Location: PACU  Anesthesia Type:General  Level of Consciousness: awake and sedated  Airway & Oxygen Therapy: Patient Spontanous Breathing and Patient connected to nasal cannula oxygen  Post-op Assessment: Report given to RN and Post -op Vital signs reviewed and stable  Post vital signs: Reviewed and stable  Last Vitals:  Vitals Value Taken Time  BP    Temp    Pulse    Resp    SpO2      Last Pain:  Vitals:   03/02/20 1258  TempSrc: Temporal  PainSc: 0-No pain         Complications: No complications documented.

## 2020-03-03 LAB — SURGICAL PATHOLOGY

## 2020-03-04 NOTE — Progress Notes (Signed)
Already talked to her after the procedure how this is likely NSAID related given her history of NSAID use. Also see comment on pathology report. Told her after the procedure to cut down NSAIDS and how we will check stool studies in the coming days.

## 2020-03-24 DIAGNOSIS — K50012 Crohn's disease of small intestine with intestinal obstruction: Secondary | ICD-10-CM | POA: Diagnosis not present

## 2020-04-12 DIAGNOSIS — K2950 Unspecified chronic gastritis without bleeding: Secondary | ICD-10-CM | POA: Diagnosis not present

## 2020-04-12 DIAGNOSIS — K582 Mixed irritable bowel syndrome: Secondary | ICD-10-CM | POA: Diagnosis not present

## 2020-04-12 DIAGNOSIS — D802 Selective deficiency of immunoglobulin A [IgA]: Secondary | ICD-10-CM | POA: Diagnosis not present

## 2020-04-22 ENCOUNTER — Other Ambulatory Visit: Payer: Self-pay

## 2020-04-22 DIAGNOSIS — F349 Persistent mood [affective] disorder, unspecified: Secondary | ICD-10-CM | POA: Diagnosis not present

## 2020-07-15 DIAGNOSIS — F349 Persistent mood [affective] disorder, unspecified: Secondary | ICD-10-CM | POA: Diagnosis not present

## 2020-07-28 ENCOUNTER — Other Ambulatory Visit: Payer: Self-pay | Admitting: Internal Medicine

## 2020-07-28 DIAGNOSIS — R002 Palpitations: Secondary | ICD-10-CM

## 2020-08-04 IMAGING — CT CT HEAD W/O CM
3 series · 16 of 46 positions shown, 19 images · non-contrast
Comparison: 04/03/2016

CLINICAL DATA: Headache, vomiting x2 days

EXAM:
CT HEAD WITHOUT CONTRAST
TECHNIQUE: Contiguous axial images were obtained from the base of the skull
through the vertex without intravenous contrast.

[Series 2: head wo · axial · 0.47mm/px · z∈[-134,-14]mm · 10 of 29 slices shown, 13 images]
[im 3/29  brain]
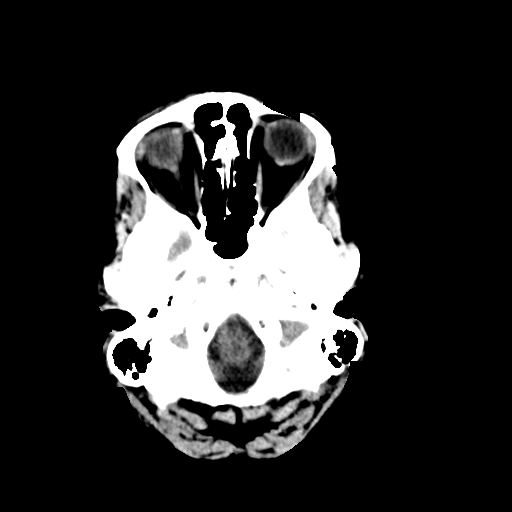
[im 3/29  bone]
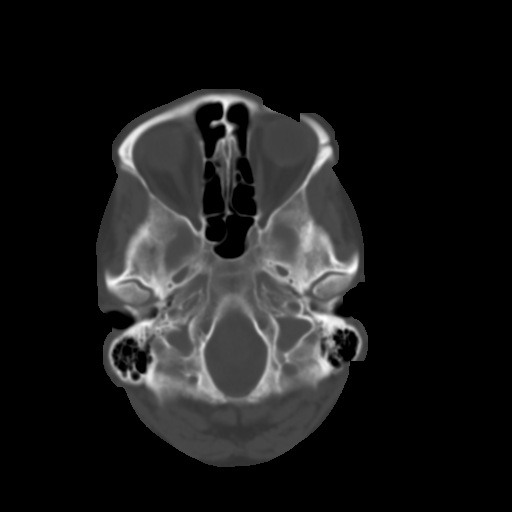
[im 6/29  brain]
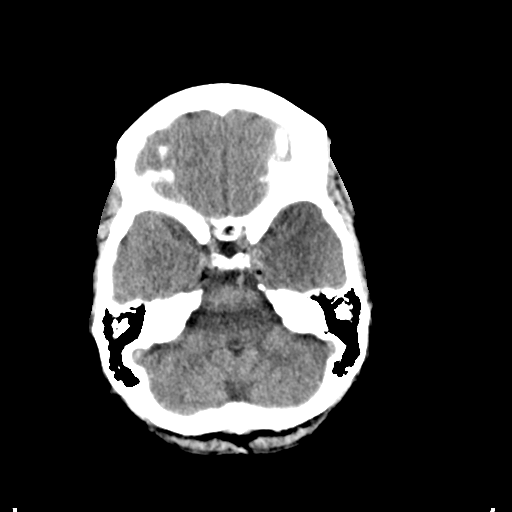
[im 8/29  brain]
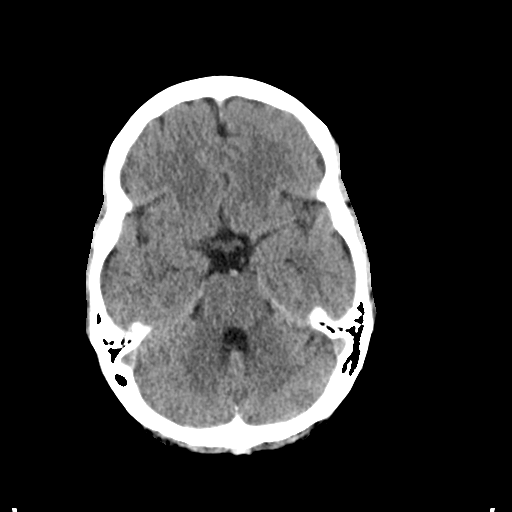
[im 11/29  brain]
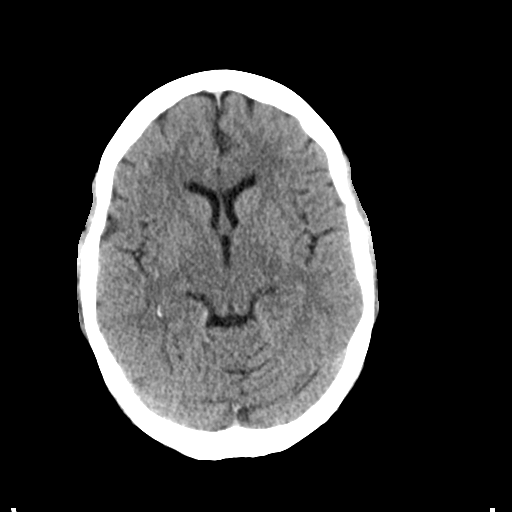
[im 14/29  brain]
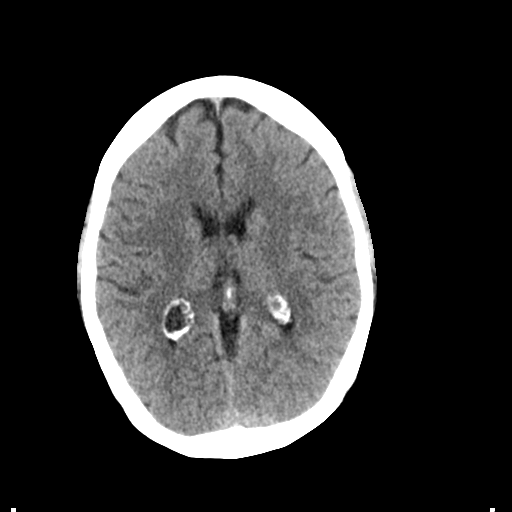
[im 14/29  bone]
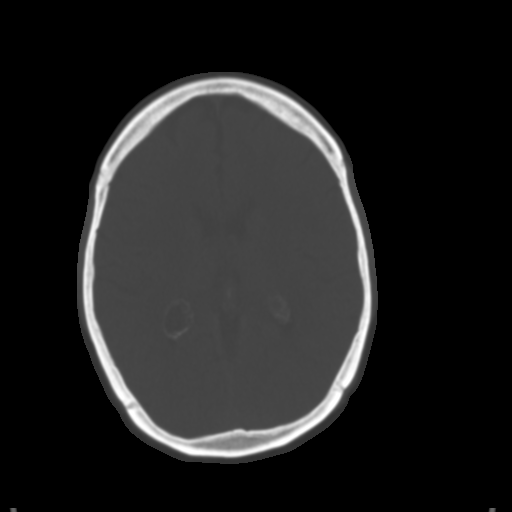
[im 16/29  brain]
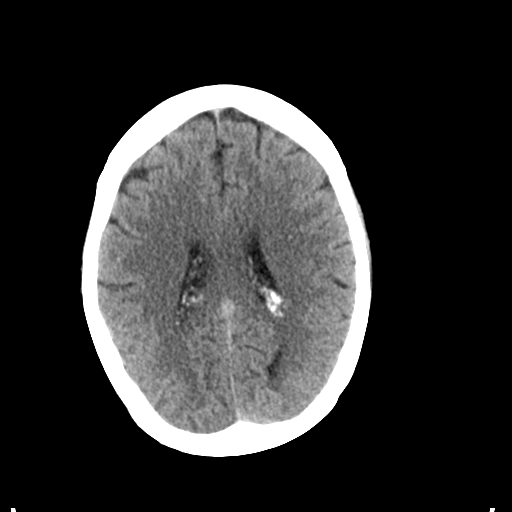
[im 19/29  brain]
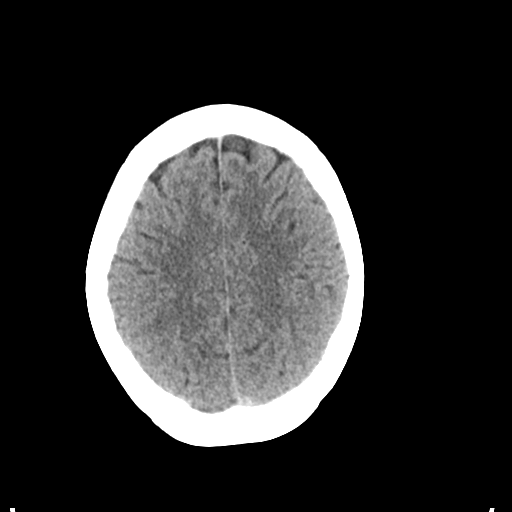
[im 22/29  brain]
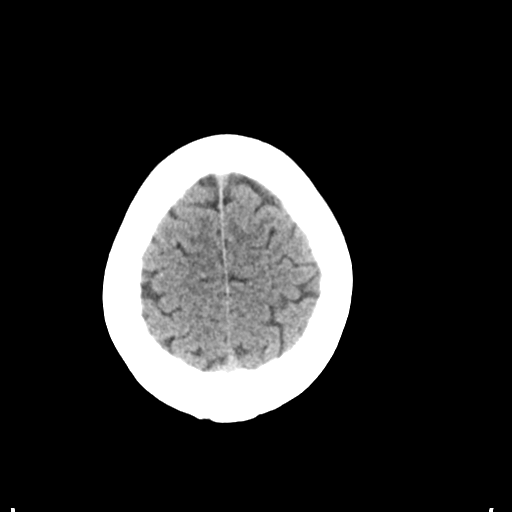
[im 24/29  brain]
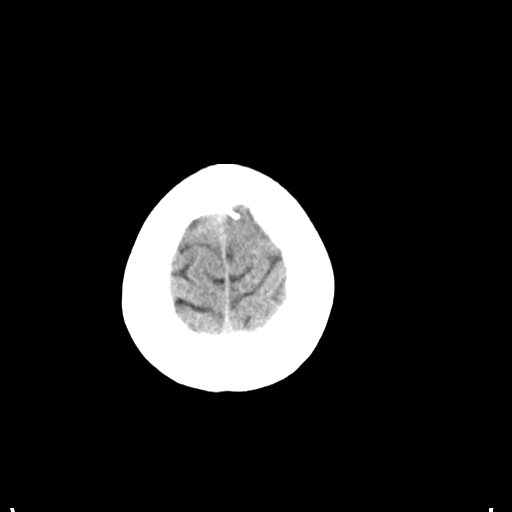
[im 24/29  bone]
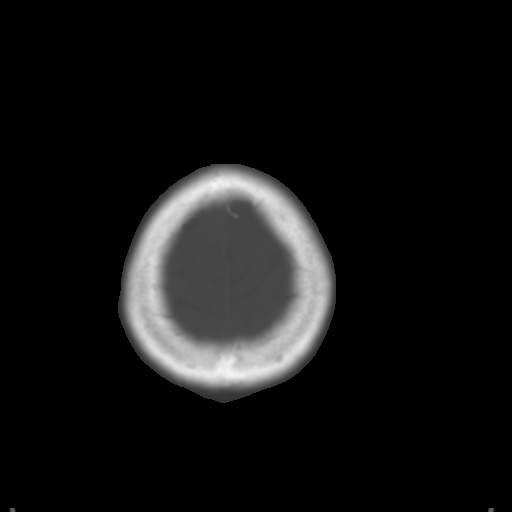
[im 27/29  brain]
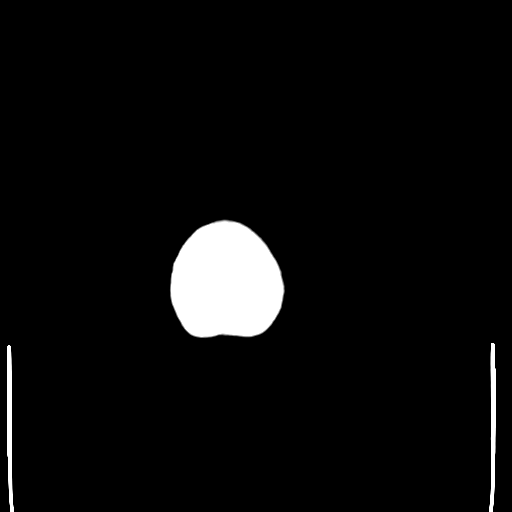

[Series 4: coronal soft tissue · coronal · 0.29mm/px · 3 of 64 slices shown]
[im 22/64  brain]
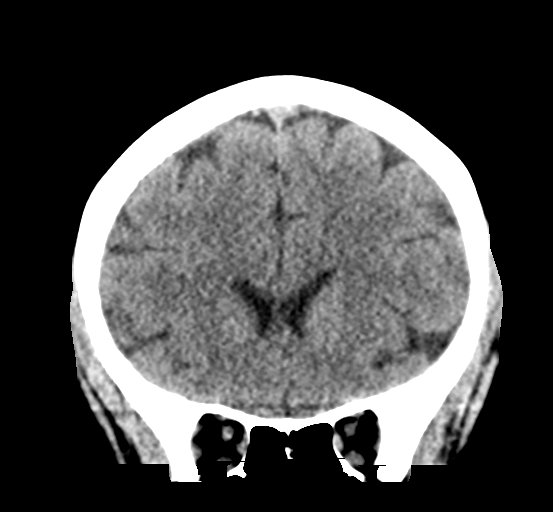
[im 29/64  brain]
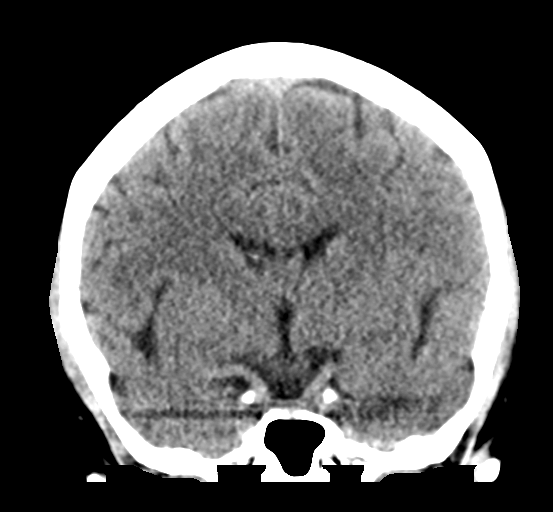
[im 36/64  brain]
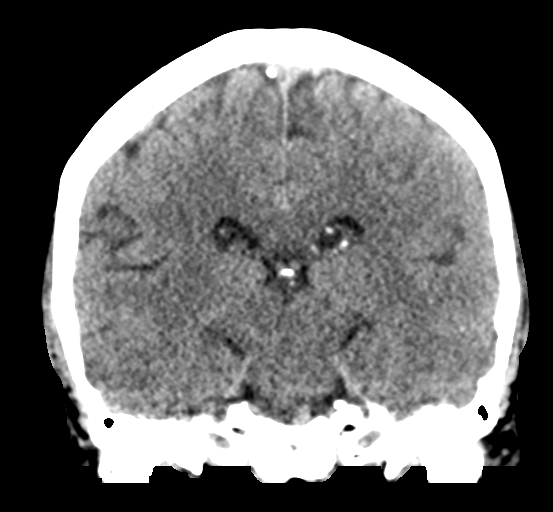

[Series 5: sagittal soft tissue · sagittal · 0.29mm/px · 3 of 55 slices shown]
[im 19/55  brain]
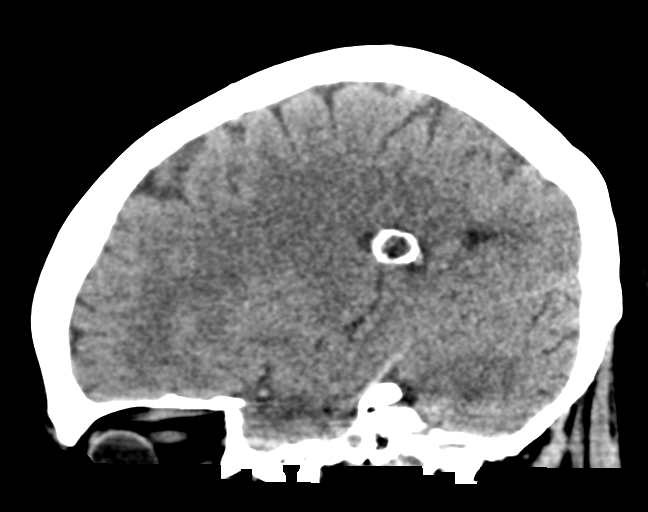
[im 28/55  brain]
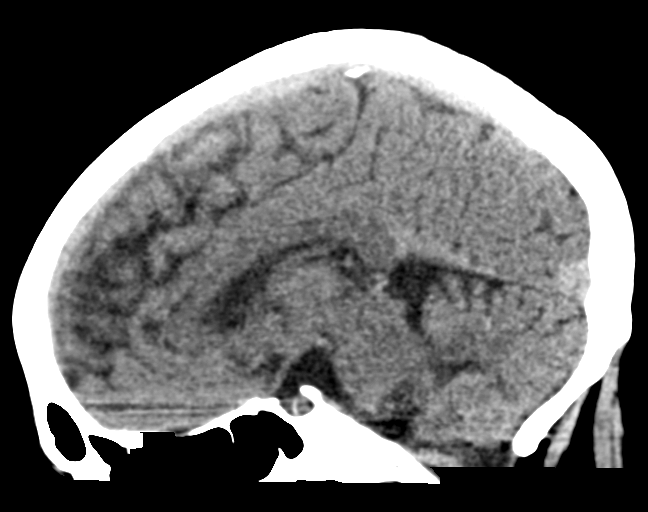
[im 37/55  brain]
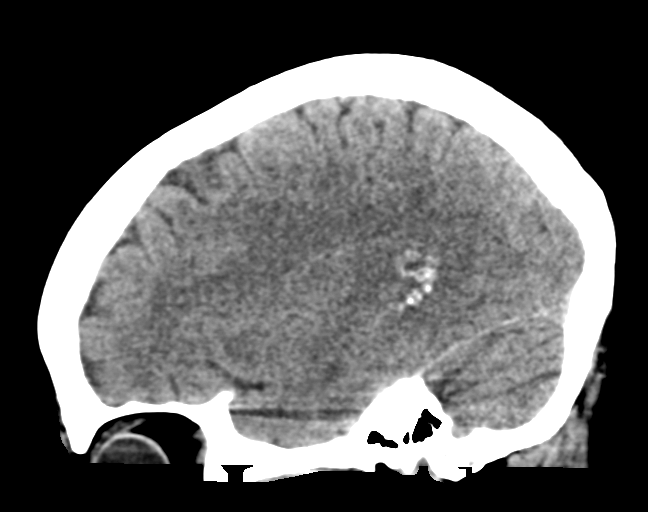

[16 of 46 positions shown; findings below may reference images not displayed]

FINDINGS: Brain: No evidence of acute infarction, hemorrhage, hydrocephalus,
extra-axial collection or mass lesion/mass effect.

Vascular: Atherosclerotic and physiologic intracranial
calcifications.

Skull: Normal. Negative for fracture or focal lesion.

Sinuses/Orbits: No acute finding.

Other: None
IMPRESSION: Negative

## 2020-09-18 ENCOUNTER — Other Ambulatory Visit: Payer: Self-pay

## 2020-09-18 MED FILL — Bupropion HCl Tab ER 24HR 150 MG: ORAL | 30 days supply | Qty: 90 | Fill #0 | Status: AC

## 2020-09-18 MED FILL — Sertraline HCl Tab 100 MG: ORAL | 30 days supply | Qty: 30 | Fill #0 | Status: AC

## 2020-09-18 MED FILL — Gabapentin Cap 300 MG: ORAL | 30 days supply | Qty: 60 | Fill #0 | Status: AC

## 2020-09-18 MED FILL — Levothyroxine Sodium Tab 25 MCG: ORAL | 90 days supply | Qty: 135 | Fill #0 | Status: AC

## 2020-11-12 ENCOUNTER — Other Ambulatory Visit: Payer: Self-pay

## 2020-11-12 ENCOUNTER — Other Ambulatory Visit: Payer: Self-pay | Admitting: Internal Medicine

## 2020-11-12 DIAGNOSIS — R002 Palpitations: Secondary | ICD-10-CM

## 2020-11-12 MED ORDER — METOPROLOL SUCCINATE ER 25 MG PO TB24
25.0000 mg | ORAL_TABLET | Freq: Every day | ORAL | 3 refills | Status: DC
  Filled 2020-11-12: qty 90, 90d supply, fill #0
  Filled 2021-04-25: qty 90, 90d supply, fill #1

## 2020-11-12 MED FILL — Sertraline HCl Tab 100 MG: ORAL | 30 days supply | Qty: 30 | Fill #1 | Status: AC

## 2020-11-12 MED FILL — Bupropion HCl Tab ER 24HR 150 MG: ORAL | 30 days supply | Qty: 90 | Fill #1 | Status: AC

## 2020-11-22 ENCOUNTER — Other Ambulatory Visit: Payer: Self-pay

## 2020-11-22 ENCOUNTER — Encounter: Payer: Self-pay | Admitting: Emergency Medicine

## 2020-11-22 ENCOUNTER — Ambulatory Visit
Admission: EM | Admit: 2020-11-22 | Discharge: 2020-11-22 | Disposition: A | Discharge status: Home / Self Care | Payer: 59 | Attending: Emergency Medicine | Admitting: Emergency Medicine

## 2020-11-22 ENCOUNTER — Telehealth: Payer: Self-pay | Admitting: Internal Medicine

## 2020-11-22 DIAGNOSIS — J0100 Acute maxillary sinusitis, unspecified: Secondary | ICD-10-CM

## 2020-11-22 MED ORDER — AZITHROMYCIN 250 MG PO TABS
250.0000 mg | ORAL_TABLET | Freq: Every day | ORAL | 0 refills | Status: DC
  Filled 2020-11-22: qty 6, 5d supply, fill #0

## 2020-11-22 NOTE — Telephone Encounter (Signed)
Patient called with cold symptoms. No appointments available. Tried to transfer patient to Access Nurse, she said no she would go to urgent care.

## 2020-11-22 NOTE — ED Provider Notes (Signed)
Michele Hammond    CSN: [id number] Arrival date & time: 11/22/20  1304      History   Chief Complaint Chief Complaint  Patient presents with  . Nasal Congestion  . Headache  . Cough    HPI Michele Hammond is a 60 y.o. female.   Patient presents with 10 day history of headache, nasal congestion, cough.  She denies fever, rash, shortness of breath, vomiting, diarrhea, or other symptoms.  OTC treatment attempted at home.  Her medical history includes seasonal allergies, hypertension, abdominal aortic aneurysm, intracranial atherosclerosis, heart murmur.  The history is provided by the patient and medical records.    Past Medical History:  Diagnosis Date  . Allergy   . Depression    Followed By Dr. Sallyanne Kuster [facility]  . Dysrhythmia   . Frequent headaches   . Heart murmur   . Hepatitis    C-pt took treatment 2015 and states does not have Hep C now (02-10-15)  . Herpes genitalis in women   . Hypertension     Patient Active Problem List   Diagnosis Date Noted  . Tinnitus 01/11/2020  . Dizziness 01/11/2020  . Intracranial atherosclerosis 12/29/2019  . Vitamin D deficiency 12/29/2019  . Abnormality of gait due to impairment of balance 12/19/2019  . Abdominal aortic aneurysm (AAA) without rupture (HCC) 12/19/2019  . Obesity (BMI 30.0-34.9) 12/19/2019  . Plantar fasciitis 12/18/2019  . Hypothyroidism 09/15/2019  . Gastroesophageal reflux disease without esophagitis 09/15/2019  . Anxiety 09/15/2019  . Stress 09/15/2019  . Annual physical exam 12/10/2017  . HLD (hyperlipidemia) 12/10/2017  . Fatty liver 11/15/2017  . HTN (hypertension) 11/15/2017  . History of hepatitis C 11/15/2017  . Tick bite 11/15/2017  . Calculus of gallbladder without cholecystitis without obstruction   . Screening for cervical cancer 07/25/2013  . Ulcer, cervix 07/25/2013  . Screening for breast cancer 06/30/2013    Past Surgical History:  Procedure Laterality Date  . BREAST BIOPSY Left  2005   benign  . CESAREAN SECTION  1990, 1996  . CHOLECYSTECTOMY N/A 02/16/2015   Procedure: LAPAROSCOPIC CHOLECYSTECTOMY;  Surgeon: Ida Rogue, MD;  Location: [facility] OR[facility];  Service: General;  Laterality: N/A;  . COLONOSCOPY WITH PROPOFOL N/A 03/02/2020   Procedure: COLONOSCOPY WITH PROPOFOL;  Surgeon: Regis Bill, MD;  Location: [facility];  Service: Endoscopy;  Laterality: N/A;  . ESOPHAGOGASTRODUODENOSCOPY (EGD) WITH PROPOFOL N/A 03/02/2020   Procedure: ESOPHAGOGASTRODUODENOSCOPY (EGD) WITH PROPOFOL;  Surgeon: Regis Bill, MD;  Location: [facility] END[facility]COPY;  Service: Endoscopy;  Laterality: N/A;  . SEPTOPLASTY  1993   Deviated Septum  . WRIST FRACTURE SURGERY      OB History   No obstetric history on file.      Home Medications    Prior to Admission medications   Medication Sig Start Date End Date Taking? Authorizing Provider  azithromycin (ZITHROMAX) 250 MG tablet Take 1 tablet (250 mg total) by mouth daily. Take first 2 tablets together, then 1 every day until finished. 11/22/20  Yes Mickie Bail, NP  buPROPion (WELLBUTRIN XL) 150 MG 24 hr tablet TAKE 3 TABLETS BY MOUTH EVERY MORNING 04/22/20 04/22/21 Yes   cetirizine-pseudoephedrine (ZYRTEC-D) 5-120 MG tablet Take 1 tablet by mouth 2 (two) times daily.   Yes [provider]  gabapentin (NEURONTIN) 300 MG capsule TAKE 1 CAPSULE BY MOUTH TWICE DAILY 11/20/19 11/19/20 Yes Morene Crocker, MD  levothyroxine (SYNTHROID) 25 MCG tablet TAKE 1 & 1/2 TABLETS BY MOUTH DAILY BEFORE  BREAKFAST 30 MINUTES BEFORE FOOD 12/29/19 12/28/20 Yes McLean-Scocuzza, Pasty Spillers, MD  loratadine (CLARITIN) 10 MG tablet Take 10 mg by mouth daily as needed for allergies.   Yes [provider]  metoprolol succinate (TOPROL-XL) 25 MG 24 hr tablet Take 1 tablet (25 mg total) by mouth at bedtime. 11/12/20  Yes McLean-Scocuzza, Pasty Spillers, MD  Multiple Vitamin (MULTIVITAMIN) capsule Take 1 capsule by mouth daily.   Yes [provider]  pantoprazole (PROTONIX) 20 MG tablet TAKE 1 TABLET BY MOUTH DAILY 30 MINUTES BEFORE FOOD 07/28/20 07/28/21 Yes McLean-Scocuzza, Pasty Spillers, MD  sertraline (ZOLOFT) 100 MG tablet TAKE 1 TABLET BY MOUTH EVERY MORNING 12/19/19 12/18/20 Yes   triamcinolone (NASACORT) 55 MCG/ACT AERO nasal inhaler Place 2 sprays into the nose daily.   Yes [provider]  buPROPion (WELLBUTRIN XL) 150 MG 24 hr tablet Take 450 mg by mouth every morning.     [provider]  gabapentin (NEURONTIN) 300 MG capsule Take 300 mg by mouth 2 (two) times daily.    [provider]  ondansetron (ZOFRAN ODT) 4 MG disintegrating tablet Take 1 tablet (4 mg total) by mouth every 8 (eight) hours as needed for nausea or vomiting. Patient not taking: Reported on 12/18/2019 09/08/19   Concha Se, MD  sertraline (ZOLOFT) 25 MG tablet 100 mg.  08/10/17   [provider]  VYVANSE 30 MG capsule  11/30/17   [provider]  zolpidem (AMBIEN) 10 MG tablet Take 10 mg by mouth at bedtime as needed for sleep. Patient not taking: No sig reported    [provider]    Family History Family History  Problem Relation Age of Onset  . Cancer Mother        uterine cancer  . Depression Mother   . Hyperlipidemia Father   . Hypertension Father   . Diabetes Father   . Hypertension Sister   . Depression Sister   . Hypertension Sister   . Hypertension Brother     Social History Social History   Tobacco Use  . Smoking status: Never Smoker  . Smokeless tobacco: Never Used  Substance Use Topics  . Alcohol use: Yes    Comment: 1 glass of wine weekly  . Drug use: No     Allergies   Amoxicillin, Penicillin g, and Sulfa antibiotics   Review of Systems Review of Systems  Constitutional: Negative for chills and fever.  HENT: Positive for congestion, postnasal drip and sinus pressure. Negative for ear pain and sore throat.   Respiratory: Positive for cough. Negative for shortness of  breath.   Cardiovascular: Negative for chest pain and palpitations.  Gastrointestinal: Negative for abdominal pain, diarrhea and vomiting.  Skin: Negative for color change and rash.  Neurological: Positive for headaches. Negative for dizziness, syncope, weakness and numbness.  All other systems reviewed and are negative.    Physical Exam Triage Vital Signs ED Triage Vitals  Enc Vitals Group     BP      Pulse      Resp      Temp      Temp src      SpO2      Weight      Height      Head Circumference      Peak Flow      Pain Score      Pain Loc      Pain Edu?      Excl. in [place]?  No data found.  Updated Vital Signs BP 113/78 (BP Location: Left Arm)   Pulse 78   Temp 99 F (37.2 C)   Resp 16   LMP 11/19/2013   SpO2 96%   Visual Acuity Right Eye Distance:   Left Eye Distance:   Bilateral Distance:    Right Eye Near:   Left Eye Near:    Bilateral Near:     Physical Exam Vitals and nursing note reviewed.  Constitutional:      General: She is not in acute distress.    Appearance: She is well-developed. She is not ill-appearing.  HENT:     Head: Normocephalic and atraumatic.     Right Ear: Tympanic membrane normal.     Left Ear: Tympanic membrane normal.     Nose: Congestion present.     Mouth/Throat:     Mouth: Mucous membranes are moist.     Pharynx: Oropharynx is clear.  Eyes:     Conjunctiva/sclera: Conjunctivae normal.  Cardiovascular:     Rate and Rhythm: Normal rate and regular rhythm.     Heart sounds: Normal heart sounds.  Pulmonary:     Effort: Pulmonary effort is normal. No respiratory distress.     Breath sounds: Normal breath sounds.  Abdominal:     Palpations: Abdomen is soft.     Tenderness: There is no abdominal tenderness.  Musculoskeletal:     Cervical back: Neck supple.  Skin:    General: Skin is warm and dry.  Neurological:     General: No focal deficit present.     Mental Status: She is alert and oriented to person, place, and  time.     Gait: Gait normal.  Psychiatric:        Mood and Affect: Mood normal.        Behavior: Behavior normal.      UC Treatments / Results  Labs (all labs ordered are listed, but only abnormal results are displayed) Labs Reviewed - No data to display  EKG   Radiology No results found.  Procedures Procedures (including critical care time)  Medications Ordered in UC Medications - No data to display  Initial Impression / Assessment and Plan / UC Course  I have reviewed the triage vital signs and the nursing notes.  Pertinent labs & imaging results that were available during my care of the patient were reviewed by me and considered in my medical decision making (see chart for details).   Acute sinusitis.  Patient declines COVID test today.  Treating with Zithromax.  Instructed her to use OTC symptom relief such as Tylenol or ibuprofen and plain over-the-counter Mucinex.  Instructed her to follow-up with her PCP if her symptoms are not improving.  She agrees to plan of care.   Final Clinical Impressions(s) / UC Diagnoses   Final diagnoses:  Acute non-recurrent maxillary sinusitis     Discharge Instructions     Take the Zithromax as directed.    Follow up with your primary care provider if your symptoms are not improving.        ED Prescriptions    Medication Sig Dispense Auth. Provider   azithromycin (ZITHROMAX) 250 MG tablet Take 1 tablet (250 mg total) by mouth daily. Take first 2 tablets together, then 1 every day until finished. 6 tablet Mickie Bail, NP     PDMP not reviewed this encounter.   Mickie Bail, NP 11/22/20 [phone]

## 2020-11-22 NOTE — Telephone Encounter (Signed)
Noted  

## 2020-11-22 NOTE — Telephone Encounter (Signed)
Noted  For your information   

## 2020-11-22 NOTE — Discharge Instructions (Signed)
Take the Zithromax as directed.  Follow up with your primary care provider if your symptoms are not improving.    

## 2020-11-22 NOTE — ED Triage Notes (Signed)
Pt presents today with c/o of Nasal Congestion, cough and headache x 1 wk.

## 2020-12-23 ENCOUNTER — Other Ambulatory Visit: Payer: Self-pay

## 2020-12-23 MED ORDER — SERTRALINE HCL 100 MG PO TABS
100.0000 mg | ORAL_TABLET | Freq: Every morning | ORAL | 11 refills | Status: DC
  Filled 2020-12-23 – 2021-01-05 (×2): qty 30, 30d supply, fill #0
  Filled 2021-02-25: qty 30, 30d supply, fill #1
  Filled 2021-04-22: qty 30, 30d supply, fill #2

## 2021-01-03 ENCOUNTER — Other Ambulatory Visit: Payer: Self-pay

## 2021-01-05 ENCOUNTER — Other Ambulatory Visit: Payer: Self-pay | Admitting: Internal Medicine

## 2021-01-05 ENCOUNTER — Other Ambulatory Visit: Payer: Self-pay

## 2021-01-05 DIAGNOSIS — E039 Hypothyroidism, unspecified: Secondary | ICD-10-CM

## 2021-01-05 MED FILL — Bupropion HCl Tab ER 24HR 150 MG: ORAL | 30 days supply | Qty: 90 | Fill #2 | Status: AC

## 2021-01-06 ENCOUNTER — Other Ambulatory Visit: Payer: Self-pay

## 2021-01-06 DIAGNOSIS — H524 Presbyopia: Secondary | ICD-10-CM | POA: Diagnosis not present

## 2021-01-06 MED ORDER — LEVOTHYROXINE SODIUM 25 MCG PO TABS
ORAL_TABLET | ORAL | 3 refills | Status: DC
  Filled 2021-01-06: qty 140, 90d supply, fill #0
  Filled 2021-04-25: qty 140, 90d supply, fill #1

## 2021-01-20 ENCOUNTER — Other Ambulatory Visit: Payer: Self-pay

## 2021-02-25 ENCOUNTER — Other Ambulatory Visit: Payer: Self-pay

## 2021-02-25 MED FILL — Bupropion HCl Tab ER 24HR 150 MG: ORAL | 30 days supply | Qty: 90 | Fill #3 | Status: AC

## 2021-04-22 ENCOUNTER — Other Ambulatory Visit: Payer: Self-pay

## 2021-04-22 MED FILL — Bupropion HCl Tab ER 24HR 150 MG: ORAL | 30 days supply | Qty: 90 | Fill #4 | Status: AC

## 2021-04-25 ENCOUNTER — Other Ambulatory Visit: Payer: Self-pay

## 2021-07-01 ENCOUNTER — Other Ambulatory Visit: Payer: Self-pay

## 2021-07-01 MED ORDER — BUPROPION HCL ER (XL) 150 MG PO TB24
450.0000 mg | ORAL_TABLET | Freq: Every morning | ORAL | 3 refills | Status: DC
  Filled 2021-07-01: qty 270, 90d supply, fill #0
  Filled 2021-12-08: qty 270, 90d supply, fill #1
  Filled 2022-03-21: qty 270, 90d supply, fill #2

## 2021-07-01 MED ORDER — SERTRALINE HCL 100 MG PO TABS
ORAL_TABLET | ORAL | 3 refills | Status: DC
  Filled 2021-07-01: qty 90, 90d supply, fill #0
  Filled 2021-12-08: qty 90, 90d supply, fill #1
  Filled 2022-03-21: qty 90, 90d supply, fill #2

## 2021-07-19 ENCOUNTER — Other Ambulatory Visit: Payer: Self-pay

## 2021-07-19 ENCOUNTER — Encounter: Payer: Self-pay | Admitting: Emergency Medicine

## 2021-07-19 ENCOUNTER — Ambulatory Visit
Admission: EM | Admit: 2021-07-19 | Discharge: 2021-07-19 | Disposition: A | Discharge status: Home / Self Care | Payer: 59 | Attending: Student | Admitting: Student

## 2021-07-19 DIAGNOSIS — J0100 Acute maxillary sinusitis, unspecified: Secondary | ICD-10-CM

## 2021-07-19 DIAGNOSIS — Z880 Allergy status to penicillin: Secondary | ICD-10-CM

## 2021-07-19 DIAGNOSIS — Z882 Allergy status to sulfonamides status: Secondary | ICD-10-CM | POA: Diagnosis not present

## 2021-07-19 MED ORDER — DOXYCYCLINE HYCLATE 100 MG PO CAPS
100.0000 mg | ORAL_CAPSULE | Freq: Two times a day (BID) | ORAL | 0 refills | Status: AC
  Filled 2021-07-19: qty 14, 7d supply, fill #0

## 2021-07-19 NOTE — ED Provider Notes (Signed)
Roderic Palau    CSN: [id number] Arrival date & time: 07/19/21  1053      History   Chief Complaint Chief Complaint  Patient presents with   Sinus Pressure   Fatigue   Sore Throat    HPI Michele Hammond is a 61 y.o. female presenting with sinus pressure, sore throat, fatigue, x2 weeks. Medical history hypertension. Has attempted multiple sinus rinses, sprays, etc without relief. Symptoms improve but then return. States postnasal drip is getting worse. Denies cough, SOB, CP, fevers/chills.  HPI  Past Medical History:  Diagnosis Date   Allergy    Depression    Followed By Dr. Tora Perches - [facility]   Dysrhythmia    Frequent headaches    Heart murmur    Hepatitis    C-pt took treatment 2015 and states does not have Hep C now (02-10-15)   Herpes genitalis in women    Hypertension     Patient Active Problem List   Diagnosis Date Noted   Tinnitus 01/11/2020   Dizziness 01/11/2020   Intracranial atherosclerosis 12/29/2019   Vitamin D deficiency 12/29/2019   Abnormality of gait due to impairment of balance 12/19/2019   Abdominal aortic aneurysm (AAA) without rupture 12/19/2019   Obesity (BMI 30.0-34.9) 12/19/2019   Plantar fasciitis 12/18/2019   Hypothyroidism 09/15/2019   Gastroesophageal reflux disease without esophagitis 09/15/2019   Anxiety 09/15/2019   Stress 09/15/2019   Annual physical exam 12/10/2017   HLD (hyperlipidemia) 12/10/2017   Fatty liver 11/15/2017   HTN (hypertension) 11/15/2017   History of hepatitis C 11/15/2017   Tick bite 11/15/2017   Calculus of gallbladder without cholecystitis without obstruction    Screening for cervical cancer 07/25/2013   Ulcer, cervix 07/25/2013   Screening for breast cancer 06/30/2013    Past Surgical History:  Procedure Laterality Date   BREAST BIOPSY Left 2005   benign   [place] N/A 02/16/2015   Procedure: LAPAROSCOPIC CHOLECYSTECTOMY;  Surgeon: Marlyce Huge,  MD;  Location: [facility];  Service: General;  Laterality: N/A;   COLONOSCOPY WITH PROPOFOL N/A 03/02/2020   Procedure: COLONOSCOPY WITH PROPOFOL;  Surgeon: Lesly Rubenstein, MD;  Location: [facility];  Service: Endoscopy;  Laterality: N/A;   ESOPHAGOGASTRODUODENOSCOPY (EGD) WITH PROPOFOL N/A 03/02/2020   Procedure: ESOPHAGOGASTRODUODENOSCOPY (EGD) WITH PROPOFOL;  Surgeon: Lesly Rubenstein, MD;  Location: [facility];  Service: Endoscopy;  Laterality: N/A;   SEPTOPLASTY  1993   Deviated Septum   WRIST FRACTURE SURGERY      OB History   No obstetric history on file.      Home Medications    Prior to Admission medications   Medication Sig Start Date End Date Taking? Authorizing Provider  doxycycline (VIBRAMYCIN) 100 MG capsule Take 1 capsule (100 mg total) by mouth 2 (two) times daily for 7 days. 07/19/21 07/26/21 Yes Hazel Sams, PA-C  buPROPion (WELLBUTRIN XL) 150 MG 24 hr tablet Take 450 mg by mouth every morning.     [provider]  buPROPion (WELLBUTRIN XL) 150 MG 24 hr tablet TAKE 3 TABLETS BY MOUTH EVERY MORNING 04/22/20 05/28/21    buPROPion (WELLBUTRIN XL) 150 MG 24 hr tablet Take 3 tablets (450 mg total) by mouth in the morning. 06/28/21     cetirizine-pseudoephedrine (ZYRTEC-D) 5-120 MG tablet Take 1 tablet by mouth 2 (two) times daily.    [provider]  gabapentin (NEURONTIN) 300 MG capsule Take 300 mg by mouth 2 (two) times  daily.    [provider]  gabapentin (NEURONTIN) 300 MG capsule TAKE 1 CAPSULE BY MOUTH TWICE DAILY 11/20/19 11/19/20  Anabel Bene, MD  levothyroxine (SYNTHROID) 25 MCG tablet TAKE 1 & 1/2 TABLETS BY MOUTH DAILY BEFORE BREAKFAST 30 MINUTES BEFORE FOOD 01/06/21 01/06/22  McLean-Scocuzza, Nino Glow, MD  loratadine (CLARITIN) 10 MG tablet Take 10 mg by mouth daily as needed for allergies.    [provider]  metoprolol succinate (TOPROL-XL) 25 MG 24 hr tablet Take 1 tablet (25 mg total) by mouth at bedtime. 11/12/20    McLean-Scocuzza, Nino Glow, MD  Multiple Vitamin (MULTIVITAMIN) capsule Take 1 capsule by mouth daily.    [provider]  ondansetron (ZOFRAN ODT) 4 MG disintegrating tablet Take 1 tablet (4 mg total) by mouth every 8 (eight) hours as needed for nausea or vomiting. Patient not taking: Reported on 12/18/2019 09/08/19   Vanessa Hudson, MD  pantoprazole (PROTONIX) 20 MG tablet TAKE 1 TABLET BY MOUTH DAILY 30 MINUTES BEFORE FOOD 07/28/20 07/28/21  McLean-Scocuzza, Nino Glow, MD  sertraline (ZOLOFT) 100 MG tablet TAKE 1 TABLET BY MOUTH EVERY MORNING 12/19/19 12/18/20    sertraline (ZOLOFT) 100 MG tablet Take 1 tablet (100 mg total) by mouth every morning. 12/22/20     sertraline (ZOLOFT) 100 MG tablet TAKE ONE TABLET BY MOUTH IN THE MORNING DAILY 06/28/21     sertraline (ZOLOFT) 25 MG tablet 100 mg.  08/10/17   [provider]  triamcinolone (NASACORT) 55 MCG/ACT AERO nasal inhaler Place 2 sprays into the nose daily.    [provider]  VYVANSE 30 MG capsule  11/30/17   [provider]  zolpidem (AMBIEN) 10 MG tablet Take 10 mg by mouth at bedtime as needed for sleep. Patient not taking: No sig reported    [provider]    Family History Family History  Problem Relation Age of Onset   Cancer Mother        uterine cancer   Depression Mother    Hyperlipidemia Father    Hypertension Father    Diabetes Father    Hypertension Sister    Depression Sister    Hypertension Sister    Hypertension Brother     Social History Social History   Tobacco Use   Smoking status: Never   Smokeless tobacco: Never  Vaping Use   Vaping Use: Never used  Substance Use Topics   Alcohol use: Yes    Comment: 1 glass of wine weekly   Drug use: No     Allergies   Amoxicillin, Penicillin g, and Sulfa antibiotics   Review of Systems Review of Systems  Constitutional:  Negative for appetite change, chills and fever.  HENT:  Positive for congestion, sinus pressure and sore  throat. Negative for ear pain, rhinorrhea and sinus pain.   Eyes:  Negative for redness and visual disturbance.  Respiratory:  Negative for cough, chest tightness, shortness of breath and wheezing.   Cardiovascular:  Negative for chest pain and palpitations.  Gastrointestinal:  Negative for abdominal pain, constipation, diarrhea, nausea and vomiting.  Genitourinary:  Negative for dysuria, frequency and urgency.  Musculoskeletal:  Negative for myalgias.  Neurological:  Negative for dizziness, weakness and headaches.  Psychiatric/Behavioral:  Negative for confusion.   All other systems reviewed and are negative.   Physical Exam Triage Vital Signs ED Triage Vitals  Enc Vitals Group     BP 07/19/21 1114 117/79     Pulse Rate 07/19/21 1114 76  Resp 07/19/21 1114 16     Temp 07/19/21 1114 98.8 F (37.1 C)     Temp Source 07/19/21 1114 Oral     SpO2 07/19/21 1114 96 %     Weight --      Height --      Head Circumference --      Peak Flow --      Pain Score 07/19/21 1113 6     Pain Loc --      Pain Edu? --      Excl. in [place]? --    No data found.  Updated Vital Signs BP 117/79 (BP Location: Left Arm)    Pulse 76    Temp 98.8 F (37.1 C) (Oral)    Resp 16    LMP 11/19/2013    SpO2 96%   Visual Acuity Right Eye Distance:   Left Eye Distance:   Bilateral Distance:    Right Eye Near:   Left Eye Near:    Bilateral Near:     Physical Exam Vitals reviewed.  Constitutional:      General: She is not in acute distress.    Appearance: Normal appearance. She is not ill-appearing.  HENT:     Head: Normocephalic and atraumatic.     Right Ear: Tympanic membrane, ear canal and external ear normal. No tenderness. No middle ear effusion. There is no impacted cerumen. Tympanic membrane is not perforated, erythematous, retracted or bulging.     Left Ear: Tympanic membrane, ear canal and external ear normal. No tenderness.  No middle ear effusion. There is no impacted cerumen. Tympanic  membrane is not perforated, erythematous, retracted or bulging.     Nose: Congestion present.     Right Sinus: Maxillary sinus tenderness present. No frontal sinus tenderness.     Left Sinus: Maxillary sinus tenderness present. No frontal sinus tenderness.     Mouth/Throat:     Mouth: Mucous membranes are moist.     Pharynx: Uvula midline. Posterior oropharyngeal erythema present. No oropharyngeal exudate.     Comments: Erythema and cobblestoning posterior pharynx. Tonsils are small and without exudate. Eyes:     Extraocular Movements: Extraocular movements intact.     Pupils: Pupils are equal, round, and reactive to light.  Cardiovascular:     Rate and Rhythm: Normal rate and regular rhythm.     Heart sounds: Normal heart sounds.  Pulmonary:     Effort: Pulmonary effort is normal.     Breath sounds: Normal breath sounds. No decreased breath sounds, wheezing, rhonchi or rales.  Abdominal:     Palpations: Abdomen is soft.     Tenderness: There is no abdominal tenderness. There is no guarding or rebound.  Lymphadenopathy:     Cervical: No cervical adenopathy.     Right cervical: No superficial cervical adenopathy.    Left cervical: No superficial cervical adenopathy.  Neurological:     General: No focal deficit present.     Mental Status: She is alert and oriented to person, place, and time.  Psychiatric:        Mood and Affect: Mood normal.        Behavior: Behavior normal.        Thought Content: Thought content normal.        Judgment: Judgment normal.     UC Treatments / Results  Labs (all labs ordered are listed, but only abnormal results are displayed) Labs Reviewed - No data to display  EKG   Radiology No  results found.  Procedures Procedures (including critical care time)  Medications Ordered in UC Medications - No data to display  Initial Impression / Assessment and Plan / UC Course  I have reviewed the triage vital signs and the nursing notes.  Pertinent  labs & imaging results that were available during my care of the patient were reviewed by me and considered in my medical decision making (see chart for details).     This patient is a very pleasant 61 y.o. year old female presenting with sinusitis X2 weeks. Afebrile, nontachy.  She is penicillin and sulfa allergic. Doxycycline as below which she has tolerated in the past.  ED return precautions discussed. Patient verbalizes understanding and agreement.    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  Make sure to wear sunscreen while spending time outside while on this medication as it can increase your chance of sunburn. You can take this medication with food if you have a sensitive stomach. -You can continue sinus sinus rinse/spray if it's helping     ED Prescriptions     Medication Sig Dispense Auth. Provider   doxycycline (VIBRAMYCIN) 100 MG capsule Take 1 capsule (100 mg total) by mouth 2 (two) times daily for 7 days. 14 capsule Hazel Sams, PA-C      PDMP not reviewed this encounter.   Hazel Sams, PA-C 07/19/21 1136

## 2021-07-19 NOTE — ED Triage Notes (Signed)
Pt c/o sinus pressure/pain, ST, and fatigue for over 2 weeks.

## 2021-07-19 NOTE — Discharge Instructions (Addendum)
-  Doxycycline twice daily for 7 days.  Make sure to wear sunscreen while spending time outside while on this medication as it can increase your chance of sunburn. You can take this medication with food if you have a sensitive stomach. -You can continue sinus sinus rinse/spray if it's helping

## 2021-07-27 ENCOUNTER — Other Ambulatory Visit: Payer: Self-pay

## 2021-07-27 ENCOUNTER — Encounter: Payer: Self-pay | Admitting: Internal Medicine

## 2021-07-27 ENCOUNTER — Ambulatory Visit (INDEPENDENT_AMBULATORY_CARE_PROVIDER_SITE_OTHER): Payer: 59 | Admitting: Internal Medicine

## 2021-07-27 ENCOUNTER — Other Ambulatory Visit (HOSPITAL_COMMUNITY)
Admission: RE | Admit: 2021-07-27 | Discharge: 2021-07-27 | Disposition: A | Discharge status: Home / Self Care | Payer: 59 | Source: Ambulatory Visit | Attending: Internal Medicine | Admitting: Internal Medicine

## 2021-07-27 VITALS — BP 116/68 | HR 73 | Temp 98.2°F | Ht 61.0 in | Wt 179.2 lb

## 2021-07-27 DIAGNOSIS — Z0000 Encounter for general adult medical examination without abnormal findings: Secondary | ICD-10-CM

## 2021-07-27 DIAGNOSIS — Z13818 Encounter for screening for other digestive system disorders: Secondary | ICD-10-CM

## 2021-07-27 DIAGNOSIS — I10 Essential (primary) hypertension: Secondary | ICD-10-CM

## 2021-07-27 DIAGNOSIS — R739 Hyperglycemia, unspecified: Secondary | ICD-10-CM | POA: Diagnosis not present

## 2021-07-27 DIAGNOSIS — E559 Vitamin D deficiency, unspecified: Secondary | ICD-10-CM | POA: Diagnosis not present

## 2021-07-27 DIAGNOSIS — Z1231 Encounter for screening mammogram for malignant neoplasm of breast: Secondary | ICD-10-CM

## 2021-07-27 DIAGNOSIS — Z1329 Encounter for screening for other suspected endocrine disorder: Secondary | ICD-10-CM | POA: Diagnosis not present

## 2021-07-27 DIAGNOSIS — R002 Palpitations: Secondary | ICD-10-CM | POA: Diagnosis not present

## 2021-07-27 DIAGNOSIS — R110 Nausea: Secondary | ICD-10-CM

## 2021-07-27 DIAGNOSIS — E039 Hypothyroidism, unspecified: Secondary | ICD-10-CM | POA: Diagnosis not present

## 2021-07-27 DIAGNOSIS — Z124 Encounter for screening for malignant neoplasm of cervix: Secondary | ICD-10-CM

## 2021-07-27 DIAGNOSIS — R197 Diarrhea, unspecified: Secondary | ICD-10-CM | POA: Diagnosis not present

## 2021-07-27 DIAGNOSIS — Z23 Encounter for immunization: Secondary | ICD-10-CM

## 2021-07-27 DIAGNOSIS — K760 Fatty (change of) liver, not elsewhere classified: Secondary | ICD-10-CM

## 2021-07-27 MED ORDER — METOPROLOL SUCCINATE ER 25 MG PO TB24
25.0000 mg | ORAL_TABLET | Freq: Every day | ORAL | 3 refills | Status: DC
  Filled 2021-07-27: qty 90, 90d supply, fill #0
  Filled 2022-02-24: qty 90, 90d supply, fill #1

## 2021-07-27 MED ORDER — PANTOPRAZOLE SODIUM 20 MG PO TBEC
DELAYED_RELEASE_TABLET | ORAL | 3 refills | Status: DC
  Filled 2021-07-27: qty 90, 90d supply, fill #0
  Filled 2022-03-21: qty 90, 90d supply, fill #1

## 2021-07-27 MED ORDER — ONDANSETRON 4 MG PO TBDP
4.0000 mg | ORAL_TABLET | Freq: Three times a day (TID) | ORAL | 2 refills | Status: DC | PRN
  Filled 2021-07-27: qty 30, 10d supply, fill #0
  Filled 2022-07-06: qty 30, 10d supply, fill #1

## 2021-07-27 MED ORDER — LEVOTHYROXINE SODIUM 25 MCG PO TABS
ORAL_TABLET | ORAL | 3 refills | Status: DC
  Filled 2021-07-27: qty 135, 90d supply, fill #0
  Filled 2021-12-08: qty 45, 30d supply, fill #1
  Filled 2022-01-11: qty 45, 30d supply, fill #2
  Filled 2022-02-24: qty 45, 30d supply, fill #3
  Filled 2022-03-21: qty 45, 30d supply, fill #4
  Filled 2022-05-01: qty 45, 30d supply, fill #5
  Filled 2022-06-06: qty 45, 30d supply, fill #6
  Filled 2022-07-06: qty 45, 30d supply, fill #7

## 2021-07-27 MED ORDER — SHINGRIX 50 MCG/0.5ML IM SUSR: 0.5000 mL | Freq: Once | INTRAMUSCULAR | 1 refills | Status: AC

## 2021-07-27 MED ORDER — BUTALBITAL-APAP-CAFFEINE 50-325-40 MG PO TABS
1.0000 | ORAL_TABLET | Freq: Two times a day (BID) | ORAL | 5 refills | Status: DC | PRN
  Filled 2021-07-27: qty 30, 8d supply, fill #0

## 2021-07-27 NOTE — Patient Instructions (Addendum)
Align or culturelle probiotics   [facility]   [address]   [place], [place] [address]   [phone]   Alphonzo Lemmings, MD   [phone] [facility] [address]   [facility]   [place], [place] [address]   [phone]   [phone] (Fax)    Magnesium 250 mg daily    Parke Simmers Diet A bland diet consists of foods that are often soft and do not have a lot of fat, fiber, or extra seasonings. Foods without fat, fiber, or seasoning are easier for the body to digest. They are also less likely to irritate your mouth, throat, stomach, and other parts of your digestive system. A bland diet is sometimes called a BRAT diet. What is my plan? Your health care provider or food and nutrition specialist (dietitian) may recommend specific changes to your diet to prevent symptoms or to treat your symptoms. These changes may include: Eating small meals often. Cooking food until it is soft enough to chew easily. Chewing your food well. Drinking fluids slowly. Not eating foods that are very spicy, sour, or fatty. Not eating citrus fruits, such as oranges and grapefruit. What do I need to know about this diet? Eat a variety of foods from the bland diet food list. Do not follow a bland diet longer than needed. Ask your health care provider whether you should take vitamins or supplements. What foods can I eat? Grains Hot cereals, such as cream of wheat. Rice. Bread, crackers, or tortillas made from refined white flour. Vegetables Canned or cooked vegetables. Mashed or boiled potatoes. Fruits Bananas. Applesauce. Other types of cooked or canned fruit with the skin and seeds removed, such as canned peaches or pears. Meats and other proteins Scrambled eggs. Creamy peanut butter or other nut butters. Lean, well-cooked meats, such as chicken or fish. Tofu. Soups or broths. Dairy Low-fat dairy products, such as milk, cottage cheese, or yogurt. Beverages Water. Herbal tea. Apple  juice. Fats and oils Mild salad dressings. Canola or olive oil. Sweets and desserts Pudding. Custard. Fruit gelatin. Ice cream. The items listed above may not be a complete list of recommended foods and beverages. Contact a dietitian for more options. What foods are not recommended? Grains Whole grain breads and cereals. Vegetables Raw vegetables. Fruits Raw fruits, especially citrus, berries, or dried fruits. Dairy Whole fat dairy foods. Beverages Caffeinated drinks. Alcohol. Seasonings and condiments Strongly flavored seasonings or condiments. Hot sauce. Salsa. Other foods Spicy foods. Fried foods. Sour foods, such as pickled or fermented foods. Foods with high sugar content. Foods high in fiber. The items listed above may not be a complete list of foods and beverages to avoid. Contact a dietitian for more information. Summary A bland diet consists of foods that are often soft and do not have a lot of fat, fiber, or extra seasonings. Foods without fat, fiber, or seasoning are easier for the body to digest. Check with your health care provider to see how long you should follow this diet plan. It is not meant to be followed for long periods. This information is not intended to replace advice given to you by your health care provider. Make sure you discuss any questions you have with your health care provider. Document Revised: 07/04/2017 Document Reviewed: 07/04/2017 Elsevier Patient Education  2022 Elsevier Inc.  Diarrhea, Adult Diarrhea is frequent loose and watery bowel movements. Diarrhea can make you feel weak and cause you to become dehydrated. Dehydration can make you tired and thirsty, cause you  to have a dry mouth, and decrease how often you urinate. Diarrhea typically lasts 2-3 days. However, it can last longer if it is a sign of something more serious. It is important to treat your diarrhea as told by your health care provider. Follow these instructions at home: Eating  and drinking   Follow these recommendations as told by your health care provider: Take an oral rehydration solution (ORS). This is an over-the-counter medicine that helps return your body to its normal balance of nutrients and water. It is found at pharmacies and retail stores. Drink plenty of fluids, such as water, ice chips, diluted fruit juice, and low-calorie sports drinks. You can drink milk also, if desired. Avoid drinking fluids that contain a lot of sugar or caffeine, such as energy drinks, sports drinks, and soda. Eat bland, easy-to-digest foods in small amounts as you are able. These foods include bananas, applesauce, rice, lean meats, toast, and crackers. Avoid alcohol. Avoid spicy or fatty foods.  Medicines Take over-the-counter and prescription medicines only as told by your health care provider. If you were prescribed an antibiotic medicine, take it as told by your health care provider. Do not stop using the antibiotic even if you start to feel better. General instructions  Wash your hands often using soap and water. If soap and water are not available, use a hand sanitizer. Others in the household should wash their hands as well. Hands should be washed: After using the toilet or changing a diaper. Before preparing, cooking, or serving food. While caring for a sick person or while visiting someone in a hospital. Drink enough fluid to keep your urine pale yellow. Rest at home while you recover. Watch your condition for any changes. Take a warm bath to relieve any burning or pain from frequent diarrhea episodes. Keep all follow-up visits as told by your health care provider. This is important. Contact a health care provider if: You have a fever. Your diarrhea gets worse. You have new symptoms. You cannot keep fluids down. You feel light-headed or dizzy. You have a headache. You have muscle cramps. Get help right away if: You have chest pain. You feel extremely weak or  you faint. You have bloody or black stools or stools that look like tar. You have severe pain, cramping, or bloating in your abdomen. You have trouble breathing or you are breathing very quickly. Your heart is beating very quickly. Your skin feels cold and clammy. You feel confused. You have signs of dehydration, such as: Dark urine, very little urine, or no urine. Cracked lips. Dry mouth. Sunken eyes. Sleepiness. Weakness. Summary Diarrhea is frequent loose and sometimes watery bowel movements. Diarrhea can make you feel weak and cause you to become dehydrated. Drink enough fluids to keep your urine pale yellow. Make sure that you wash your hands after using the toilet. If soap and water are not available, use hand sanitizer. Contact a health care provider if your diarrhea gets worse or you have new symptoms. Get help right away if you have signs of dehydration. This information is not intended to replace advice given to you by your health care provider. Make sure you discuss any questions you have with your health care provider. Document Revised: 12/15/2020 Document Reviewed: 12/15/2020 Elsevier Patient Education  2022 [employer].

## 2021-07-27 NOTE — Progress Notes (Signed)
Chief Complaint  Patient presents with   Gynecologic Exam   Annual Exam   Annual  1. C/o head resolved for now but wants refill fiorcet started taking vitamin D again and h/a better  2. Fasting labs today  3. C/o watery diarrhea n/v/d x 2 days since Sunday after eating junk food nothing tried  c/o nausea and lack of appetite 07/19/21 was on doxycycline     Review of Systems  Gastrointestinal:  Positive for diarrhea, nausea and vomiting.  Neurological:  Positive for headaches.  Past Medical History:  Diagnosis Date   Allergy    COVID-19    late 2022   Depression    Followed By Dr. Tora Perches - [facility]   Dysrhythmia    Frequent headaches    Heart murmur    Hepatitis    C-pt took treatment 2015 and states does not have Hep C now (02-10-15)   Herpes genitalis in women    Hypertension    Past Surgical History:  Procedure Laterality Date   BREAST BIOPSY Left 2005   benign   [place] N/A 02/16/2015   Procedure: LAPAROSCOPIC CHOLECYSTECTOMY;  Surgeon: Marlyce Huge, MD;  Location: [facility] ORS;  Service: General;  Laterality: N/A;   COLONOSCOPY WITH PROPOFOL N/A 03/02/2020   Procedure: COLONOSCOPY WITH PROPOFOL;  Surgeon: Lesly Rubenstein, MD;  Location: [facility] ENDOSCOPY;  Service: Endoscopy;  Laterality: N/A;   ESOPHAGOGASTRODUODENOSCOPY (EGD) WITH PROPOFOL N/A 03/02/2020   Procedure: ESOPHAGOGASTRODUODENOSCOPY (EGD) WITH PROPOFOL;  Surgeon: Lesly Rubenstein, MD;  Location: [facility] ENDOSCOPY;  Service: Endoscopy;  Laterality: N/A;   SEPTOPLASTY  1993   Deviated Septum   WRIST FRACTURE SURGERY     Family History  Problem Relation Age of Onset   Cancer Mother        uterine cancer   Depression Mother    Hyperlipidemia Father    Hypertension Father    Diabetes Father    Hypertension Sister    Depression Sister    Hypertension Sister    Hypertension Brother    Social History   Socioeconomic History   Marital status: Single     Spouse name: Not on file   Number of children: 3   Years of education: 14   Highest education level: Not on file  Occupational History   Occupation: Regulatory affairs officer: [employer]    Comment: Overly  Tobacco Use   Smoking status: Never   Smokeless tobacco: Never  Vaping Use   Vaping Use: Never used  Substance and Sexual Activity   Alcohol use: Yes    Comment: 1 glass of wine weekly   Drug use: No   Sexual activity: Not on file  Other Topics Concern   Not on file  Social History Narrative   Derrian grew up in [place], [place]. She is currently living in [place], [place]. She is living alone. 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 Gerilyn enjoys riding her horses on her spare time. She has two horses and a pony.   Social Determinants of Health   Financial Resource Strain: Not on file  Food Insecurity: Not on file  Transportation Needs: Not on file  Physical Activity: Not on file  Stress: Not on file  Social Connections: Not on file  Intimate Partner Violence: Not on file   Current Meds  Medication Sig   buPROPion (WELLBUTRIN XL) 150 MG 24 hr tablet Take 450  mg by mouth every morning.    buPROPion (WELLBUTRIN XL) 150 MG 24 hr tablet Take 3 tablets (450 mg total) by mouth in the morning.   cetirizine-pseudoephedrine (ZYRTEC-D) 5-120 MG tablet Take 1 tablet by mouth 2 (two) times daily.   loratadine (CLARITIN) 10 MG tablet Take 10 mg by mouth daily as needed for allergies.   Multiple Vitamin (MULTIVITAMIN) capsule Take 1 capsule by mouth daily.   sertraline (ZOLOFT) 100 MG tablet Take 1 tablet (100 mg total) by mouth every morning.   sertraline (ZOLOFT) 100 MG tablet TAKE ONE TABLET BY MOUTH IN THE MORNING DAILY   sertraline (ZOLOFT) 25 MG tablet 100 mg.    triamcinolone (NASACORT) 55 MCG/ACT AERO nasal inhaler Place 2 sprays into the nose daily.   VYVANSE 30 MG capsule    zolpidem (AMBIEN) 10 MG tablet Take 10 mg by mouth at bedtime as needed for  sleep.   Zoster Vaccine Adjuvanted [facility]) injection Inject 0.5 mLs into the muscle once for 1 dose. X 2 does   [DISCONTINUED] levothyroxine (SYNTHROID) 25 MCG tablet TAKE 1 & 1/2 TABLETS BY MOUTH DAILY BEFORE BREAKFAST 30 MINUTES BEFORE FOOD   [DISCONTINUED] metoprolol succinate (TOPROL-XL) 25 MG 24 hr tablet Take 1 tablet (25 mg total) by mouth at bedtime.   [DISCONTINUED] ondansetron (ZOFRAN ODT) 4 MG disintegrating tablet Take 1 tablet (4 mg total) by mouth every 8 (eight) hours as needed for nausea or vomiting.   [DISCONTINUED] pantoprazole (PROTONIX) 20 MG tablet TAKE 1 TABLET BY MOUTH DAILY 30 MINUTES BEFORE FOOD   Allergies  Allergen Reactions   Amoxicillin Rash   Penicillin G Rash   Sulfa Antibiotics Rash   No results found for this or any previous visit (from the past 2160 hour(s)). Objective  Body mass index is 33.86 kg/m. Wt Readings from Last 3 Encounters:  07/27/21 179 lb 3.2 oz (81.3 kg)  03/02/20 178 lb (80.7 kg)  12/18/19 175 lb 6.4 oz (79.6 kg)   Temp Readings from Last 3 Encounters:  07/27/21 98.2 F (36.8 C) (Oral)  07/19/21 98.8 F (37.1 C) (Oral)  11/22/20 99 F (37.2 C)   BP Readings from Last 3 Encounters:  07/27/21 116/68  07/19/21 117/79  11/22/20 113/78   Pulse Readings from Last 3 Encounters:  07/27/21 73  07/19/21 76  11/22/20 78    Physical Exam Vitals and nursing note reviewed.  Constitutional:      Appearance: Normal appearance. She is well-developed and well-groomed.  HENT:     Head: Normocephalic and atraumatic.  Eyes:     Conjunctiva/sclera: Conjunctivae normal.     Pupils: Pupils are equal, round, and reactive to light.  Cardiovascular:     Rate and Rhythm: Normal rate and regular rhythm.     Heart sounds: Normal heart sounds. No murmur heard. Pulmonary:     Effort: Pulmonary effort is normal.     Breath sounds: Normal breath sounds.  Chest:  Breasts:    Breasts are symmetrical.     Right: Normal.     Left: Normal.   Abdominal:     General: Abdomen is flat. Bowel sounds are normal.     Tenderness: There is no abdominal tenderness.  Musculoskeletal:        General: No tenderness.  Lymphadenopathy:     Upper Body:     Right upper body: No axillary adenopathy.     Left upper body: No axillary adenopathy.  Skin:    General: Skin is warm and dry.  Neurological:     General: No focal deficit present.     Mental Status: She is alert and oriented to person, place, and time. Mental status is at baseline.     Cranial Nerves: Cranial nerves 2-12 are intact.     Motor: Motor function is intact.     Coordination: Coordination is intact.     Gait: Gait is intact.  Psychiatric:        Attention and Perception: Attention and perception normal.        Mood and Affect: Mood and affect normal.        Speech: Speech normal.        Behavior: Behavior normal. Behavior is cooperative.        Thought Content: Thought content normal.        Cognition and Memory: Cognition and memory normal.        Judgment: Judgment normal.    Assessment  Plan  Annual physical exam - Plan: Comprehensive metabolic panel See below   Screening mammogram for breast cancer - Plan: MM 3D SCREEN BREAST BILATERAL  Watery diarrhea - Plan: C Difficile Quick Screen w PCR reflex, Comprehensive metabolic panel, CBC w/Diff, Urinalysis, Routine w reflex microscopic  Nausea Prn zofran    Routine cervical smear - Plan: Cytology - PAP( [place])  Need for shingles vaccine - Plan: Zoster Vaccine Adjuvanted [facility]) injection  Primary hypertension Controlled toprol xl 25 mg qd   Fatty liver Consider hep A vaccine in the future hep B immune  Vitamin D deficiency - Plan: Vitamin D (25 hydroxy)  Hypothyroidism On levo 25 as directed  Hyperglycemia - Plan: Hemoglobin A1c  Hypothyroidism, unspecified type - Plan: TSH, levothyroxine (SYNTHROID) 25 MCG tablet  Palpitations - Plan: metoprolol succinate (TOPROL-XL) 25 MG 24 hr  tablet, pantoprazole (PROTONIX) 20 MG tablet   HM Flu shot utd at [facility] 9/or 03/2021  covid 2/2+3 total pfizer  tdap utd Hep B immune  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 Nicole Kindred no issues   DEXA consider age 16      rec healthy diet and exercise    Cards 09/11/19 Dr. Ubaldo Glassing ekg stress, holter, echo stress ordered f/u 09/25/19 and 10/09/19; results normal      Neurology appt 09/18/19 Dr. Melrose Nakayama for h/a initial consult    Abdominal aortic aneurysm (AAA) without rupture ([place]) 2.5 cm  [place] repeat due 08/2024   Provider: Dr. Olivia Mackie McLean-Scocuzza-[facility] Medicine

## 2021-07-28 ENCOUNTER — Encounter: Payer: Self-pay | Admitting: Internal Medicine

## 2021-07-28 ENCOUNTER — Other Ambulatory Visit
Admission: RE | Admit: 2021-07-28 | Discharge: 2021-07-28 | Disposition: A | Discharge status: Home / Self Care | Payer: 59 | Source: Ambulatory Visit | Attending: Internal Medicine | Admitting: Internal Medicine

## 2021-07-28 DIAGNOSIS — R197 Diarrhea, unspecified: Secondary | ICD-10-CM | POA: Diagnosis not present

## 2021-07-28 LAB — CBC WITH DIFFERENTIAL/PLATELET
Basophils Absolute: 0 10*3/uL (ref 0.0–0.1)
Basophils Relative: 0.8 % (ref 0.0–3.0)
Eosinophils Absolute: 0.1 10*3/uL (ref 0.0–0.7)
Eosinophils Relative: 1.5 % (ref 0.0–5.0)
HCT: 40.5 % (ref 36.0–46.0)
Hemoglobin: 13.5 g/dL (ref 12.0–15.0)
Lymphocytes Relative: 29.7 % (ref 12.0–46.0)
Lymphs Abs: 1.8 10*3/uL (ref 0.7–4.0)
MCHC: 33.3 g/dL (ref 30.0–36.0)
MCV: 88 fl (ref 78.0–100.0)
Monocytes Absolute: 0.4 10*3/uL (ref 0.1–1.0)
Monocytes Relative: 7.2 % (ref 3.0–12.0)
Neutro Abs: 3.6 10*3/uL (ref 1.4–7.7)
Neutrophils Relative %: 60.8 % (ref 43.0–77.0)
Platelets: 214 10*3/uL (ref 150.0–400.0)
RBC: 4.61 Mil/uL (ref 3.87–5.11)
RDW: 13.6 % (ref 11.5–15.5)
WBC: 5.9 10*3/uL (ref 4.0–10.5)

## 2021-07-28 LAB — URINALYSIS, ROUTINE W REFLEX MICROSCOPIC
Bilirubin Urine: NEGATIVE
Glucose, UA: NEGATIVE
Hgb urine dipstick: NEGATIVE
Ketones, ur: NEGATIVE
Leukocytes,Ua: NEGATIVE
Nitrite: NEGATIVE
Protein, ur: NEGATIVE
Specific Gravity, Urine: 1.005 (ref 1.001–1.035)
pH: 6.5 (ref 5.0–8.0)

## 2021-07-28 LAB — COMPREHENSIVE METABOLIC PANEL
ALT: 16 U/L (ref 0–35)
AST: 21 U/L (ref 0–37)
Albumin: 4.2 g/dL (ref 3.5–5.2)
Alkaline Phosphatase: 48 U/L (ref 39–117)
BUN: 11 mg/dL (ref 6–23)
CO2: 31 mEq/L (ref 19–32)
Calcium: 9.3 mg/dL (ref 8.4–10.5)
Chloride: 101 mEq/L (ref 96–112)
Creatinine, Ser: 0.67 mg/dL (ref 0.40–1.20)
GFR: 95.79 mL/min (ref >60.00)
Glucose, Bld: 91 mg/dL (ref 70–99)
Potassium: 3.8 mEq/L (ref 3.5–5.1)
Sodium: 138 mEq/L (ref 135–145)
Total Bilirubin: 0.6 mg/dL (ref 0.2–1.2)
Total Protein: 6.7 g/dL (ref 6.0–8.3)

## 2021-07-28 LAB — LIPID PANEL
Cholesterol: 215 mg/dL — ABNORMAL HIGH (ref 0–200)
HDL: 40.3 mg/dL (ref >39.00)
LDL Cholesterol: 139 mg/dL — ABNORMAL HIGH (ref 0–99)
NonHDL: 174.36
Total CHOL/HDL Ratio: 5
Triglycerides: 177 mg/dL — ABNORMAL HIGH (ref 0.0–149.0)
VLDL: 35.4 mg/dL (ref 0.0–40.0)

## 2021-07-28 LAB — VITAMIN D 25 HYDROXY (VIT D DEFICIENCY, FRACTURES): VITD: 27.63 ng/mL — ABNORMAL LOW (ref 30.00–100.00)

## 2021-07-28 LAB — TSH: TSH: 1.77 u[IU]/mL (ref 0.35–5.50)

## 2021-07-28 LAB — C DIFFICILE QUICK SCREEN W PCR REFLEX
C Diff antigen: NEGATIVE
C Diff interpretation: NOT DETECTED
C Diff toxin: NEGATIVE

## 2021-07-28 LAB — HEMOGLOBIN A1C: Hgb A1c MFr Bld: 5.6 % (ref 4.6–6.5)

## 2021-07-29 LAB — CYTOLOGY - PAP
Comment: NEGATIVE
Diagnosis: NEGATIVE
High risk HPV: NEGATIVE

## 2021-08-08 ENCOUNTER — Other Ambulatory Visit: Payer: Self-pay

## 2021-08-15 ENCOUNTER — Telehealth: Payer: Self-pay | Admitting: Internal Medicine

## 2021-08-15 NOTE — Telephone Encounter (Signed)
Tilford Pillar, CMA  08/15/2021 11:30 AM EST Back to Top    Left message to return call.   Letter mailed to call office back.    Derinda Sis [place], [place]  08/02/2021 10:22 AM EST     Lvm for pt to return call in regards to labs.    Pasty Spillers McLean-Scocuzza, MD  07/29/2021  4:32 PM EST     Pap normal negative HPV Repeat in 3-5 years Cholesterol elevated  -is pt agreeable to statin cholesterol medication?  Vit D low rec D3 4000 to 5000 IU daily otc Liver kidneys normal Blood cts normal  Thyroid normal  A1c no prediabetes but borderline Urine normal Negative C diff can try probiotic align or culturelle and as needed immodium or peptobismol for diarrhea if not resolved

## 2021-12-09 ENCOUNTER — Other Ambulatory Visit: Payer: Self-pay

## 2021-12-12 ENCOUNTER — Other Ambulatory Visit: Payer: Self-pay

## 2021-12-14 ENCOUNTER — Other Ambulatory Visit: Payer: Self-pay

## 2021-12-26 ENCOUNTER — Other Ambulatory Visit: Payer: Self-pay

## 2021-12-26 MED ORDER — CEPHALEXIN 500 MG PO CAPS
ORAL_CAPSULE | ORAL | 0 refills | Status: DC
  Filled 2021-12-26: qty 30, 10d supply, fill #0

## 2022-01-11 ENCOUNTER — Other Ambulatory Visit: Payer: Self-pay

## 2022-02-24 ENCOUNTER — Other Ambulatory Visit: Payer: Self-pay

## 2022-03-21 ENCOUNTER — Other Ambulatory Visit: Payer: Self-pay

## 2022-05-01 ENCOUNTER — Other Ambulatory Visit: Payer: Self-pay

## 2022-06-06 ENCOUNTER — Other Ambulatory Visit: Payer: Self-pay

## 2022-07-06 ENCOUNTER — Other Ambulatory Visit: Payer: Self-pay

## 2022-07-07 ENCOUNTER — Other Ambulatory Visit: Payer: Self-pay

## 2022-07-07 ENCOUNTER — Other Ambulatory Visit: Payer: Self-pay | Admitting: Family Medicine

## 2022-07-11 DIAGNOSIS — F349 Persistent mood [affective] disorder, unspecified: Secondary | ICD-10-CM | POA: Diagnosis not present

## 2022-08-08 DIAGNOSIS — F349 Persistent mood [affective] disorder, unspecified: Secondary | ICD-10-CM | POA: Diagnosis not present

## 2022-08-14 ENCOUNTER — Other Ambulatory Visit: Payer: Self-pay

## 2022-08-14 ENCOUNTER — Encounter: Payer: Self-pay | Admitting: Emergency Medicine

## 2022-08-14 ENCOUNTER — Ambulatory Visit
Admission: EM | Admit: 2022-08-14 | Discharge: 2022-08-14 | Disposition: A | Discharge status: Home / Self Care | Payer: Commercial Managed Care - PPO | Attending: Physician Assistant | Admitting: Physician Assistant

## 2022-08-14 DIAGNOSIS — J069 Acute upper respiratory infection, unspecified: Secondary | ICD-10-CM | POA: Insufficient documentation

## 2022-08-14 DIAGNOSIS — R0981 Nasal congestion: Secondary | ICD-10-CM | POA: Diagnosis not present

## 2022-08-14 DIAGNOSIS — J029 Acute pharyngitis, unspecified: Secondary | ICD-10-CM | POA: Insufficient documentation

## 2022-08-14 LAB — GROUP A STREP BY PCR: Group A Strep by PCR: NOT DETECTED

## 2022-08-14 MED ORDER — IPRATROPIUM BROMIDE 0.06 % NA SOLN
2.0000 | Freq: Four times a day (QID) | NASAL | 0 refills | Status: DC
  Filled 2022-08-14: qty 15, 19d supply, fill #0

## 2022-08-14 MED ORDER — DOXYCYCLINE HYCLATE 100 MG PO CAPS
100.0000 mg | ORAL_CAPSULE | Freq: Two times a day (BID) | ORAL | 0 refills | Status: AC
  Filled 2022-08-14: qty 14, 7d supply, fill #0

## 2022-08-14 NOTE — ED Triage Notes (Signed)
Pt presents with a productive cough, runny nose, fever, sore throat and sinus pressure x 1 week. She was seen [facility] and her respiratory panel was negative.

## 2022-08-14 NOTE — Discharge Instructions (Signed)
-  Negative strep. - Symptoms are most likely due to a virus and most viruses get better in 1 to 2 weeks - However, if you feel that your symptoms or not improving in the next 3 days or they worsen you may start the antibiotic but make sure you take the full course. - Continue with OTC decongestants.  I sent a nasal spray for you as well.

## 2022-08-14 NOTE — ED Provider Notes (Signed)
[facility]    CSN: [id number] Arrival date & time: 08/14/22  [id number]      History   Chief Complaint Chief Complaint  Patient presents with   Sore Throat   Sinus Pressure    Cough   Nasal Congestion    HPI Michele Hammond is a 62 y.o. female presenting for 1 week history of cough, congestion, sore throat, postnasal drainage, sinus pressure.  She reports a fever up to 101 degrees from Monday through Thursday of last week.  Reports the fever had resolved and she was feeling better until today when she woke up she had a fever of 100 degrees and increased sinus pressure and nasal drainage.  Believes she has a sinus infection.  States she was tested for COVID and flu last week and everything was negative.  She has been taking over-the-counter decongestants without relief.  HPI  Past Medical History:  Diagnosis Date   Allergy    COVID-19    late 2022   Depression    Followed By Dr. Tora Perches - [facility]   Dysrhythmia    Frequent headaches    Heart murmur    Hepatitis    C-pt took treatment 2015 and states does not have Hep C now (02-10-15)   Herpes genitalis in women    Hypertension     Patient Active Problem List   Diagnosis Date Noted   Tinnitus 01/11/2020   Dizziness 01/11/2020   Intracranial atherosclerosis 12/29/2019   Vitamin D deficiency 12/29/2019   Abnormality of gait due to impairment of balance 12/19/2019   Abdominal aortic aneurysm (AAA) without rupture ([place]) 12/19/2019   Obesity (BMI 30.0-34.9) 12/19/2019   Plantar fasciitis 12/18/2019   Hypothyroidism 09/15/2019   Gastroesophageal reflux disease without esophagitis 09/15/2019   Anxiety 09/15/2019   Stress 09/15/2019   Annual physical exam 12/10/2017   HLD (hyperlipidemia) 12/10/2017   Fatty liver 11/15/2017   HTN (hypertension) 11/15/2017   History of hepatitis C 11/15/2017   Tick bite 11/15/2017   Calculus of gallbladder without cholecystitis without obstruction    Screening for cervical cancer  07/25/2013   Ulcer, cervix 07/25/2013   Screening for breast cancer 06/30/2013    Past Surgical History:  Procedure Laterality Date   BREAST BIOPSY Left 2005   benign   [place] N/A 02/16/2015   Procedure: LAPAROSCOPIC CHOLECYSTECTOMY;  Surgeon: Marlyce Huge, MD;  Location: [facility] ORS;  Service: General;  Laterality: N/A;   COLONOSCOPY WITH PROPOFOL N/A 03/02/2020   Procedure: COLONOSCOPY WITH PROPOFOL;  Surgeon: Lesly Rubenstein, MD;  Location: [facility] ENDOSCOPY;  Service: Endoscopy;  Laterality: N/A;   ESOPHAGOGASTRODUODENOSCOPY (EGD) WITH PROPOFOL N/A 03/02/2020   Procedure: ESOPHAGOGASTRODUODENOSCOPY (EGD) WITH PROPOFOL;  Surgeon: Lesly Rubenstein, MD;  Location: [facility] ENDOSCOPY;  Service: Endoscopy;  Laterality: N/A;   SEPTOPLASTY  1993   Deviated Septum   WRIST FRACTURE SURGERY      OB History   No obstetric history on file.      Home Medications    Prior to Admission medications   Medication Sig Start Date End Date Taking? Authorizing Provider  doxycycline (VIBRAMYCIN) 100 MG capsule Take 1 capsule (100 mg total) by mouth 2 (two) times daily for 7 days. 08/14/22 08/21/22 Yes Laurene Footman B, PA-C  ipratropium (ATROVENT) 0.06 % nasal spray Place 2 sprays into both nostrils 4 (four) times daily. 08/14/22  Yes Danton Clap, PA-C  levothyroxine (SYNTHROID) 25 MCG tablet TAKE 1 &  1/2 TABLETS BY MOUTH DAILY BEFORE BREAKFAST 30 MINUTES BEFORE FOOD 07/27/21 08/14/22 Yes McLean-Scocuzza, Nino Glow, MD  buPROPion (WELLBUTRIN XL) 150 MG 24 hr tablet Take 450 mg by mouth every morning.     [provider]  buPROPion (WELLBUTRIN XL) 150 MG 24 hr tablet TAKE 3 TABLETS BY MOUTH EVERY MORNING 04/22/20 05/28/21    buPROPion (WELLBUTRIN XL) 150 MG 24 hr tablet Take 3 tablets (450 mg total) by mouth in the morning. 06/28/21     butalbital-acetaminophen-caffeine (FIORICET) 50-325-40 MG tablet Take 1-2 tablets by mouth 2 (two) times daily as needed  for headache. 07/27/21   McLean-Scocuzza, Nino Glow, MD  cephALEXin (KEFLEX) 500 MG capsule Take one tablet every 8 hours until finished. 12/26/21     cetirizine-pseudoephedrine (ZYRTEC-D) 5-120 MG tablet Take 1 tablet by mouth 2 (two) times daily.    [provider]  gabapentin (NEURONTIN) 300 MG capsule TAKE 1 CAPSULE BY MOUTH TWICE DAILY 11/20/19 11/19/20  Anabel Bene, MD  loratadine (CLARITIN) 10 MG tablet Take 10 mg by mouth daily as needed for allergies.    [provider]  metoprolol succinate (TOPROL-XL) 25 MG 24 hr tablet Take 1 tablet (25 mg total) by mouth at bedtime. 07/27/21   McLean-Scocuzza, Nino Glow, MD  Multiple Vitamin (MULTIVITAMIN) capsule Take 1 capsule by mouth daily.    [provider]  ondansetron (ZOFRAN-ODT) 4 MG disintegrating tablet Take 1 tablet (4 mg total) by mouth every 8 (eight) hours as needed for nausea or vomiting. 07/27/21   McLean-Scocuzza, Nino Glow, MD  pantoprazole (PROTONIX) 20 MG tablet TAKE 1 TABLET BY MOUTH DAILY 30 MINUTES BEFORE FOOD 07/27/21 07/27/22  McLean-Scocuzza, Nino Glow, MD  sertraline (ZOLOFT) 100 MG tablet TAKE 1 TABLET BY MOUTH EVERY MORNING 12/19/19 12/18/20    sertraline (ZOLOFT) 100 MG tablet Take 1 tablet (100 mg total) by mouth every morning. 12/22/20     sertraline (ZOLOFT) 100 MG tablet TAKE ONE TABLET BY MOUTH IN THE MORNING DAILY 06/28/21     sertraline (ZOLOFT) 25 MG tablet 100 mg.  08/10/17   [provider]  triamcinolone (NASACORT) 55 MCG/ACT AERO nasal inhaler Place 2 sprays into the nose daily.    [provider]  VYVANSE 30 MG capsule  11/30/17   [provider]  zolpidem (AMBIEN) 10 MG tablet Take 10 mg by mouth at bedtime as needed for sleep.    [provider]    Family History Family History  Problem Relation Age of Onset   Cancer Mother        uterine cancer   Depression Mother    Hyperlipidemia Father    Hypertension Father    Diabetes Father    Hypertension Sister     Depression Sister    Hypertension Sister    Hypertension Brother     Social History Social History   Tobacco Use   Smoking status: Never   Smokeless tobacco: Never  Vaping Use   Vaping Use: Never used  Substance Use Topics   Alcohol use: Yes    Comment: 1 glass of wine weekly   Drug use: No     Allergies   Amoxicillin, Penicillin g, and Sulfa antibiotics   Review of Systems Review of Systems  Constitutional:  Positive for fatigue and fever. Negative for chills and diaphoresis.  HENT:  Positive for congestion, rhinorrhea, sinus pressure and sore throat. Negative for ear pain and sinus pain.   Respiratory:  Positive for cough. Negative for  shortness of breath.   Gastrointestinal:  Negative for abdominal pain, nausea and vomiting.  Musculoskeletal:  Negative for arthralgias and myalgias.  Skin:  Negative for rash.  Neurological:  Negative for weakness and headaches.  Hematological:  Negative for adenopathy.     Physical Exam Triage Vital Signs ED Triage Vitals  Enc Vitals Group     BP      Pulse      Resp      Temp      Temp src      SpO2      Weight      Height      Head Circumference      Peak Flow      Pain Score      Pain Loc      Pain Edu?      Excl. in [place]?    No data found.  Updated Vital Signs BP 139/87 (BP Location: Left Arm)   Pulse 80   Temp 98.5 F (36.9 C) (Oral)   Resp 16   LMP 11/19/2013   SpO2 100%      Physical Exam Vitals and nursing note reviewed.  Constitutional:      General: She is not in acute distress.    Appearance: Normal appearance. She is not ill-appearing or toxic-appearing.  HENT:     Head: Normocephalic and atraumatic.     Right Ear: Ear canal and external ear normal. A middle ear effusion is present.     Left Ear: Ear canal and external ear normal. A middle ear effusion is present.     Nose: Congestion present.     Mouth/Throat:     Mouth: Mucous membranes are moist.     Pharynx: Oropharynx is clear. Posterior  oropharyngeal erythema present.  Eyes:     General: No scleral icterus.       Right eye: No discharge.        Left eye: No discharge.     Conjunctiva/sclera: Conjunctivae normal.  Cardiovascular:     Rate and Rhythm: Normal rate and regular rhythm.     Heart sounds: Normal heart sounds.  Pulmonary:     Effort: Pulmonary effort is normal. No respiratory distress.     Breath sounds: Normal breath sounds.  Musculoskeletal:     Cervical back: Neck supple.  Skin:    General: Skin is dry.  Neurological:     General: No focal deficit present.     Mental Status: She is alert. Mental status is at baseline.     Motor: No weakness.     Gait: Gait normal.  Psychiatric:        Mood and Affect: Mood normal.        Behavior: Behavior normal.        Thought Content: Thought content normal.      UC Treatments / Results  Labs (all labs ordered are listed, but only abnormal results are displayed) Labs Reviewed  GROUP A STREP BY PCR    EKG   Radiology No results found.  Procedures Procedures (including critical care time)  Medications Ordered in UC Medications - No data to display  Initial Impression / Assessment and Plan / UC Course  I have reviewed the triage vital signs and the nursing notes.  Pertinent labs & imaging results that were available during my care of the patient were reviewed by me and considered in my medical decision making (see chart for details).   62 year old female presents  for cough, congestion, sinus pressure for the past week.  Reports fever up to 101 degrees for the first few days which resolved and she reported improvement in symptoms until today when symptoms worsened and she reports a return of the fever up to 100 degrees.  Negative respiratory panel last week.  Taking OTC decongestants.  Vitals are all normal and stable.  She says she took Advil this morning.  On exam she has clear effusion of bilateral TMs but more so on the right, nasal congestion  and posterior pharyngeal erythema.  Chest clear to auscultation.  PCR strep test performed.  Negative.  Reviewed results of strep test with patient.  Likely viral URI and patient needs to get some more time which I explained to her.  However, since she has been recently feeling worse and now has a new low-grade fever, this could be the beginning of bacterial sinusitis.  I printed prescription for doxycycline antibiotic since she has allergy to penicillins.  Advised her to start this if she is not improving over the next 3 days or if symptoms worsen.  Also sent Atrovent nasal spray advised her to continue decongestants.  Work note given.   Final Clinical Impressions(s) / UC Diagnoses   Final diagnoses:  Upper respiratory tract infection, unspecified type  Nasal congestion  Sore throat     Discharge Instructions      -Negative strep. - Symptoms are most likely due to a virus and most viruses get better in 1 to 2 weeks - However, if you feel that your symptoms or not improving in the next 3 days or they worsen you may start the antibiotic but make sure you take the full course. - Continue with OTC decongestants.  I sent a nasal spray for you as well.     ED Prescriptions     Medication Sig Dispense Auth. Provider   doxycycline (VIBRAMYCIN) 100 MG capsule Take 1 capsule (100 mg total) by mouth 2 (two) times daily for 7 days. 14 capsule Laurene Footman B, PA-C   ipratropium (ATROVENT) 0.06 % nasal spray Place 2 sprays into both nostrils 4 (four) times daily. 15 mL Danton Clap, PA-C      PDMP not reviewed this encounter.   Danton Clap, PA-C 08/14/22 [phone]

## 2022-08-15 DIAGNOSIS — F349 Persistent mood [affective] disorder, unspecified: Secondary | ICD-10-CM | POA: Diagnosis not present

## 2022-08-21 ENCOUNTER — Other Ambulatory Visit: Payer: Self-pay

## 2022-08-25 ENCOUNTER — Other Ambulatory Visit: Payer: Self-pay

## 2022-08-29 ENCOUNTER — Other Ambulatory Visit: Payer: Self-pay

## 2022-08-31 ENCOUNTER — Other Ambulatory Visit: Payer: Self-pay

## 2022-09-04 ENCOUNTER — Other Ambulatory Visit: Payer: Self-pay

## 2022-09-05 ENCOUNTER — Telehealth: Payer: Self-pay

## 2022-09-05 ENCOUNTER — Other Ambulatory Visit: Payer: Self-pay

## 2022-09-05 NOTE — Telephone Encounter (Signed)
Patient states the cool air feels like little needles on her leg and the warm sun on her neck and arms feels like needles.  Patient states this has been going on for a couple of years but it has gotten worse since her medication has run out.  Top half sensitive to heat and lower half sensitive to cold.  Prescription Request  09/05/2022  LOV: Visit date not found  What is the name of the medication or equipment? levothyroxine (SYNTHROID) 25 MCG tablet (Expired)  Have you contacted your pharmacy to request a refill? Yes   Which pharmacy would you like this sent to?  [facility] [place] [place] [address] Phone: [phone] Fax: [phone]    Patient notified that their request is being sent to the clinical staff for review and that they should receive a response within 2 business days.   Please advise at Mobile [phone] (mobile)  Patient states she has been out of the medication since the first weekend in March 2024.  Patient has scheduled a TOC visit with Tomasita Morrow on 10/04/2022.

## 2022-09-07 ENCOUNTER — Other Ambulatory Visit: Payer: Self-pay

## 2022-09-07 DIAGNOSIS — E039 Hypothyroidism, unspecified: Secondary | ICD-10-CM

## 2022-09-07 MED ORDER — LEVOTHYROXINE SODIUM 25 MCG PO TABS
ORAL_TABLET | ORAL | 3 refills | Status: DC
  Filled 2022-09-07: qty 135, 90d supply, fill #0
  Filled 2022-12-11: qty 135, 90d supply, fill #1
  Filled 2023-02-26 – 2023-03-08 (×2): qty 135, 90d supply, fill #2
  Filled 2023-07-18: qty 135, 90d supply, fill #3

## 2022-09-12 DIAGNOSIS — F349 Persistent mood [affective] disorder, unspecified: Secondary | ICD-10-CM | POA: Diagnosis not present

## 2022-09-15 ENCOUNTER — Other Ambulatory Visit: Payer: Self-pay

## 2022-10-04 ENCOUNTER — Ambulatory Visit: Payer: Commercial Managed Care - PPO | Admitting: Nurse Practitioner

## 2022-10-04 ENCOUNTER — Other Ambulatory Visit: Payer: Self-pay

## 2022-10-04 ENCOUNTER — Encounter: Payer: Self-pay | Admitting: Nurse Practitioner

## 2022-10-04 VITALS — BP 120/90 | HR 62 | Temp 98.1°F | Ht 61.0 in | Wt 188.4 lb

## 2022-10-04 DIAGNOSIS — E559 Vitamin D deficiency, unspecified: Secondary | ICD-10-CM

## 2022-10-04 DIAGNOSIS — I10 Essential (primary) hypertension: Secondary | ICD-10-CM | POA: Diagnosis not present

## 2022-10-04 DIAGNOSIS — E039 Hypothyroidism, unspecified: Secondary | ICD-10-CM | POA: Diagnosis not present

## 2022-10-04 DIAGNOSIS — E669 Obesity, unspecified: Secondary | ICD-10-CM

## 2022-10-04 DIAGNOSIS — F419 Anxiety disorder, unspecified: Secondary | ICD-10-CM | POA: Diagnosis not present

## 2022-10-04 DIAGNOSIS — Z1231 Encounter for screening mammogram for malignant neoplasm of breast: Secondary | ICD-10-CM

## 2022-10-04 DIAGNOSIS — K219 Gastro-esophageal reflux disease without esophagitis: Secondary | ICD-10-CM | POA: Diagnosis not present

## 2022-10-04 DIAGNOSIS — E785 Hyperlipidemia, unspecified: Secondary | ICD-10-CM | POA: Diagnosis not present

## 2022-10-04 DIAGNOSIS — K760 Fatty (change of) liver, not elsewhere classified: Secondary | ICD-10-CM | POA: Diagnosis not present

## 2022-10-04 LAB — CBC WITH DIFFERENTIAL/PLATELET
Basophils Absolute: 0 10*3/uL (ref 0.0–0.1)
Basophils Relative: 0.8 % (ref 0.0–3.0)
Eosinophils Absolute: 0.2 10*3/uL (ref 0.0–0.7)
Eosinophils Relative: 2.9 % (ref 0.0–5.0)
HCT: 39.8 % (ref 36.0–46.0)
Hemoglobin: 13.5 g/dL (ref 12.0–15.0)
Lymphocytes Relative: 40.7 % (ref 12.0–46.0)
Lymphs Abs: 2.4 10*3/uL (ref 0.7–4.0)
MCHC: 33.9 g/dL (ref 30.0–36.0)
MCV: 88.4 fl (ref 78.0–100.0)
Monocytes Absolute: 0.4 10*3/uL (ref 0.1–1.0)
Monocytes Relative: 6.3 % (ref 3.0–12.0)
Neutro Abs: 2.9 10*3/uL (ref 1.4–7.7)
Neutrophils Relative %: 49.3 % (ref 43.0–77.0)
Platelets: 256 10*3/uL (ref 150.0–400.0)
RBC: 4.5 Mil/uL (ref 3.87–5.11)
RDW: 13.7 % (ref 11.5–15.5)
WBC: 5.9 10*3/uL (ref 4.0–10.5)

## 2022-10-04 LAB — COMPREHENSIVE METABOLIC PANEL
ALT: 17 U/L (ref 0–35)
AST: 17 U/L (ref 0–37)
Albumin: 4.5 g/dL (ref 3.5–5.2)
Alkaline Phosphatase: 46 U/L (ref 39–117)
BUN: 12 mg/dL (ref 6–23)
CO2: 28 mEq/L (ref 19–32)
Calcium: 9.6 mg/dL (ref 8.4–10.5)
Chloride: 104 mEq/L (ref 96–112)
Creatinine, Ser: 0.68 mg/dL (ref 0.40–1.20)
GFR: 94.65 mL/min (ref >60.00)
Glucose, Bld: 84 mg/dL (ref 70–99)
Potassium: 4.7 mEq/L (ref 3.5–5.1)
Sodium: 140 mEq/L (ref 135–145)
Total Bilirubin: 0.5 mg/dL (ref 0.2–1.2)
Total Protein: 6.9 g/dL (ref 6.0–8.3)

## 2022-10-04 LAB — LIPID PANEL
Cholesterol: 250 mg/dL — ABNORMAL HIGH (ref 0–200)
HDL: 52.8 mg/dL (ref >39.00)
NonHDL: 196.81
Total CHOL/HDL Ratio: 5
Triglycerides: 252 mg/dL — ABNORMAL HIGH (ref 0.0–149.0)
VLDL: 50.4 mg/dL — ABNORMAL HIGH (ref 0.0–40.0)

## 2022-10-04 LAB — VITAMIN D 25 HYDROXY (VIT D DEFICIENCY, FRACTURES): VITD: 30.89 ng/mL (ref 30.00–100.00)

## 2022-10-04 LAB — TSH: TSH: 2.56 u[IU]/mL (ref 0.35–5.50)

## 2022-10-04 LAB — HEMOGLOBIN A1C: Hgb A1c MFr Bld: 5.6 % (ref 4.6–6.5)

## 2022-10-04 LAB — LDL CHOLESTEROL, DIRECT: Direct LDL: 163 mg/dL

## 2022-10-04 MED ORDER — BUPROPION HCL ER (XL) 150 MG PO TB24
450.0000 mg | ORAL_TABLET | Freq: Every morning | ORAL | 3 refills | Status: DC
  Filled 2022-10-04 – 2022-11-08 (×2): qty 270, 90d supply, fill #0
  Filled 2023-02-26 – 2023-03-08 (×2): qty 270, 90d supply, fill #1
  Filled 2023-07-19: qty 270, 90d supply, fill #2

## 2022-10-04 MED ORDER — METOPROLOL SUCCINATE ER 25 MG PO TB24
25.0000 mg | ORAL_TABLET | Freq: Every day | ORAL | 3 refills | Status: DC
Start: 2022-10-04 — End: 2023-10-15
  Filled 2022-10-04 – 2022-12-11 (×2): qty 90, 90d supply, fill #0
  Filled 2023-02-23 – 2023-03-08 (×2): qty 90, 90d supply, fill #1
  Filled 2023-07-19: qty 90, 90d supply, fill #2

## 2022-10-04 MED ORDER — PANTOPRAZOLE SODIUM 20 MG PO TBEC
20.0000 mg | DELAYED_RELEASE_TABLET | Freq: Every day | ORAL | 3 refills | Status: DC
Start: 2022-10-04 — End: 2024-03-31
  Filled 2022-10-04 – 2023-03-08 (×3): qty 90, 90d supply, fill #0

## 2022-10-04 NOTE — Assessment & Plan Note (Addendum)
Hx of binge eating disorder. Was on Vyvanse, stopped due to side effects. Will check A1c today. Encouraged healthy diet and exercise. Counseled patient on low carb and high protein diet, advised adequate water intake and avoiding soda and sweets. Will monitor.

## 2022-10-04 NOTE — Assessment & Plan Note (Signed)
Chronic. Patient declined starting on statin medication last year. She wants to try and control it with her diet. Will check lipids today. The 10-year ASCVD risk score (Arnett DK, et al., 2019) is: 5.1%. Encouraged healthy diet and exercise.

## 2022-10-04 NOTE — Patient Instructions (Signed)
YOUR MAMMOGRAM IS DUE, PLEASE CALL AND GET THIS SCHEDULED! [facility] - call [phone]    

## 2022-10-04 NOTE — Assessment & Plan Note (Signed)
Chronic. Stable on Protonix 20 mg PRN. Continue. Refills sent. Encouraged patient to monitor diet for triggers, avoid spicy and acidic foods, and decrease caffeine intake. Dietary information provided to patient.

## 2022-10-04 NOTE — Assessment & Plan Note (Signed)
Chronic. Stable on Toprol XL 25 mg daily. Continue. Refills sent. Diastolic slightly elevated today in office. Encouraged patient to start checking blood pressure at home and keep a log to bring with her at next appointment. Will continue to monitor.

## 2022-10-04 NOTE — Assessment & Plan Note (Signed)
Chronic. Stable on Levothyroxine 25 mcg one and a half tablets daily. Continue. Will check TSH today.

## 2022-10-04 NOTE — Assessment & Plan Note (Signed)
Chronic. Stable on Wellbutrin XL 450 mg daily and Zoloft 100 mg daily. Continue. Refills on Wellbutrin sent. PHQ- 7 and GAD- 4. Denies SI/HI. She is followed by Psychiatry. Encouraged to follow up as scheduled.

## 2022-10-04 NOTE — Progress Notes (Signed)
Bethanie Dicker, NP-C Phone: [phone]  Michele Hammond is a 62 y.o. female who presents today for transfer of care. She has no complaints or new concerns today. She is doing well on her medications and requesting refills.   HYPERTENSION Disease Monitoring Home BP Monitoring- Not checking Chest pain- No    Dyspnea- No Medications Compliance-  Toprol XL, not taking consistently. Lightheadedness-  No  Edema- No BMET    Component Value Date/Time   NA 138 07/27/2021 1437   K 3.8 07/27/2021 1437   CL 101 07/27/2021 1437   CO2 31 07/27/2021 1437   GLUCOSE 91 07/27/2021 1437   BUN 11 07/27/2021 1437   CREATININE 0.67 07/27/2021 1437   CREATININE 0.55 06/25/2014 1617   CALCIUM 9.3 07/27/2021 1437   GFRNONAA >60 09/08/2019 1146   GFRNONAA >89 06/25/2014 1617   GFRAA >60 09/08/2019 1146   GFRAA >89 06/25/2014 1617   HYPERLIPIDEMIA Symptoms Chest pain on exertion:  No   Leg claudication:   No Medications: Compliance- None, diet controlled Right upper quadrant pain- No  Muscle aches- No Lipid Panel     Component Value Date/Time   CHOL 215 (H) 07/27/2021 1437   TRIG 177.0 (H) 07/27/2021 1437   HDL 40.30 07/27/2021 1437   CHOLHDL 5 07/27/2021 1437   VLDL 35.4 07/27/2021 1437   LDLCALC 139 (H) 07/27/2021 1437    HYPOTHYROIDISM Disease Monitoring Weight changes: No  Skin Changes: No Palpitations: No Heat/Cold intolerance: No Medication Monitoring Compliance:  Levothyroxine    Last TSH:   Lab Results  Component Value Date   TSH 1.77 07/27/2021   GERD:   Reflux symptoms: Heartburn   Abd pain: No   Blood in stool: No  Dysphagia: No   EGD: 03/02/2020  Medication: Protonix 20 mg PRN   Social History   Tobacco Use  Smoking Status Never  Smokeless Tobacco Never    Current Outpatient Medications on File Prior to Visit  Medication Sig Dispense Refill   butalbital-acetaminophen-caffeine (FIORICET) 50-325-40 MG tablet Take 1-2 tablets by mouth 2 (two) times daily as needed for  headache. 30 tablet 5   cetirizine-pseudoephedrine (ZYRTEC-D) 5-120 MG tablet Take 1 tablet by mouth 2 (two) times daily.     ipratropium (ATROVENT) 0.06 % nasal spray Place 2 sprays into both nostrils 4 (four) times daily. 15 mL 0   levothyroxine (SYNTHROID) 25 MCG tablet TAKE 1 & 1/2 TABLETS BY MOUTH DAILY BEFORE BREAKFAST 30 MINUTES BEFORE FOOD 140 tablet 3   loratadine (CLARITIN) 10 MG tablet Take 10 mg by mouth daily as needed for allergies.     Multiple Vitamin (MULTIVITAMIN) capsule Take 1 capsule by mouth daily.     ondansetron (ZOFRAN-ODT) 4 MG disintegrating tablet Take 1 tablet (4 mg total) by mouth every 8 (eight) hours as needed for nausea or vomiting. 30 tablet 2   sertraline (ZOLOFT) 100 MG tablet TAKE ONE TABLET BY MOUTH IN THE MORNING DAILY 90 tablet 3   triamcinolone (NASACORT) 55 MCG/ACT AERO nasal inhaler Place 2 sprays into the nose daily.     No current facility-administered medications on file prior to visit.    ROS see history of present illness  Objective  Physical Exam Vitals:   10/04/22 1055 10/04/22 1117  BP: (!) 128/90 (!) 120/90  Pulse: 62   Temp: 98.1 F (36.7 C)   SpO2: 98%     BP Readings from Last 3 Encounters:  10/04/22 (!) 120/90  08/14/22 139/87  07/27/21 116/68  Wt Readings from Last 3 Encounters:  10/04/22 188 lb 6.4 oz (85.5 kg)  07/27/21 179 lb 3.2 oz (81.3 kg)  03/02/20 178 lb (80.7 kg)    Physical Exam Constitutional:      General: She is not in acute distress.    Appearance: Normal appearance.  HENT:     Head: Normocephalic.  Cardiovascular:     Rate and Rhythm: Normal rate and regular rhythm.     Heart sounds: Normal heart sounds.  Pulmonary:     Effort: Pulmonary effort is normal.     Breath sounds: Normal breath sounds.  Skin:    General: Skin is warm and dry.  Neurological:     General: No focal deficit present.     Mental Status: She is alert.  Psychiatric:        Mood and Affect: Mood normal.        Behavior:  Behavior normal.    Assessment/Plan: Please see individual problem list.  Primary hypertension Assessment & Plan: Chronic. Stable on Toprol XL 25 mg daily. Continue. Refills sent. Diastolic slightly elevated today in office. Encouraged patient to start checking blood pressure at home and keep a log to bring with her at next appointment. Will continue to monitor.   Orders: -     Metoprolol Succinate ER; Take 1 tablet (25 mg total) by mouth at bedtime.  Dispense: 90 tablet; Refill: 3 -     CBC with Differential/Platelet  Hyperlipidemia, unspecified hyperlipidemia type Assessment & Plan: Chronic. Patient declined starting on statin medication last year. She wants to try and control it with her diet. Will check lipids today. The 10-year ASCVD risk score (Arnett DK, et al., 2019) is: 5.1%. Encouraged healthy diet and exercise.   Orders: -     Lipid panel  Hypothyroidism, unspecified type Assessment & Plan: Chronic. Stable on Levothyroxine 25 mcg one and a half tablets daily. Continue. Will check TSH today.  Orders: -     TSH  Gastroesophageal reflux disease without esophagitis Assessment & Plan: Chronic. Stable on Protonix 20 mg PRN. Continue. Refills sent. Encouraged patient to monitor diet for triggers, avoid spicy and acidic foods, and decrease caffeine intake. Dietary information provided to patient.   Orders: -     Pantoprazole Sodium; Take 1 tablet (20 mg total) by mouth daily 30 minutes before food.  Dispense: 90 tablet; Refill: 3  Anxiety Assessment & Plan: Chronic. Stable on Wellbutrin XL 450 mg daily and Zoloft 100 mg daily. Continue. Refills on Wellbutrin sent. PHQ- 7 and GAD- 4. Denies SI/HI. She is followed by Psychiatry. Encouraged to follow up as scheduled.  Orders: -     buPROPion HCl ER (XL); Take 3 tablets (450 mg total) by mouth in the morning.  Dispense: 270 tablet; Refill: 3  Obesity (BMI 30-39.9) Assessment & Plan: Hx of binge eating disorder. Was on  Vyvanse, stopped due to side effects. Will check A1c today. Encouraged healthy diet and exercise. Counseled patient on low carb and high protein diet, advised adequate water intake and avoiding soda and sweets. Will monitor.   Orders: -     Hemoglobin A1c  Vitamin D deficiency Assessment & Plan: Chronic. Has OTC daily supplement at home she occasionally takes. Will check vitamin D level today.   Orders: -     VITAMIN D 25 Hydroxy (Vit-D Deficiency, Fractures)  Fatty liver -     Comprehensive metabolic panel  Screening mammogram for breast cancer -     3D Screening  Mammogram, Left and Right; Future   Return in about 6 months (around 04/05/2023) for Follow up.   Bethanie Dicker, NP-C [facility] - [employer]

## 2022-10-04 NOTE — Assessment & Plan Note (Signed)
Chronic. Has OTC daily supplement at home she occasionally takes. Will check vitamin D level today.

## 2022-10-10 DIAGNOSIS — F349 Persistent mood [affective] disorder, unspecified: Secondary | ICD-10-CM | POA: Diagnosis not present

## 2022-10-12 ENCOUNTER — Telehealth: Payer: Self-pay

## 2022-10-12 NOTE — Telephone Encounter (Signed)
LMOM to CB in regards to labs. 

## 2022-10-20 ENCOUNTER — Other Ambulatory Visit: Payer: Self-pay

## 2022-11-07 ENCOUNTER — Other Ambulatory Visit: Payer: Self-pay

## 2022-11-07 DIAGNOSIS — F349 Persistent mood [affective] disorder, unspecified: Secondary | ICD-10-CM | POA: Diagnosis not present

## 2022-11-08 ENCOUNTER — Other Ambulatory Visit: Payer: Self-pay

## 2022-11-08 MED ORDER — BUPROPION HCL ER (XL) 150 MG PO TB24
450.0000 mg | ORAL_TABLET | Freq: Every morning | ORAL | 3 refills | Status: DC
  Filled 2022-11-08: qty 270, 90d supply, fill #0

## 2022-11-08 MED ORDER — SERTRALINE HCL 100 MG PO TABS
100.0000 mg | ORAL_TABLET | Freq: Every morning | ORAL | 3 refills | Status: DC
  Filled 2022-11-08: qty 90, 90d supply, fill #0
  Filled 2023-02-26 – 2023-03-08 (×2): qty 90, 90d supply, fill #1

## 2022-12-05 DIAGNOSIS — F349 Persistent mood [affective] disorder, unspecified: Secondary | ICD-10-CM | POA: Diagnosis not present

## 2022-12-11 ENCOUNTER — Other Ambulatory Visit (HOSPITAL_BASED_OUTPATIENT_CLINIC_OR_DEPARTMENT_OTHER): Payer: Self-pay

## 2022-12-11 ENCOUNTER — Other Ambulatory Visit: Payer: Self-pay | Admitting: Nurse Practitioner

## 2022-12-11 ENCOUNTER — Other Ambulatory Visit: Payer: Self-pay

## 2022-12-12 ENCOUNTER — Other Ambulatory Visit: Payer: Self-pay

## 2022-12-12 MED FILL — Butalbital-Acetaminophen-Caffeine Tab 50-325-40 MG: ORAL | 8 days supply | Qty: 30 | Fill #0 | Status: AC

## 2022-12-14 ENCOUNTER — Other Ambulatory Visit: Payer: Self-pay

## 2023-01-09 DIAGNOSIS — F349 Persistent mood [affective] disorder, unspecified: Secondary | ICD-10-CM | POA: Diagnosis not present

## 2023-02-13 DIAGNOSIS — F349 Persistent mood [affective] disorder, unspecified: Secondary | ICD-10-CM | POA: Diagnosis not present

## 2023-02-26 ENCOUNTER — Other Ambulatory Visit: Payer: Self-pay | Admitting: Nurse Practitioner

## 2023-02-26 ENCOUNTER — Other Ambulatory Visit: Payer: Self-pay | Admitting: Physician Assistant

## 2023-02-26 ENCOUNTER — Other Ambulatory Visit: Payer: Self-pay

## 2023-02-26 DIAGNOSIS — J309 Allergic rhinitis, unspecified: Secondary | ICD-10-CM

## 2023-02-27 ENCOUNTER — Other Ambulatory Visit: Payer: Self-pay

## 2023-02-27 MED ORDER — CETIRIZINE-PSEUDOEPHEDRINE ER 5-120 MG PO TB12
1.0000 | ORAL_TABLET | Freq: Two times a day (BID) | ORAL | 2 refills | Status: AC
Start: 2023-02-27 — End: ?
  Filled 2023-02-27: qty 36, 18d supply, fill #0
  Filled 2023-07-19: qty 12, 6d supply, fill #0
  Filled 2023-10-15: qty 24, 12d supply, fill #1
  Filled 2023-10-22: qty 12, 6d supply, fill #1
  Filled 2024-01-14: qty 24, 12d supply, fill #2
  Filled 2024-02-21: qty 24, 12d supply, fill #3

## 2023-02-27 MED ORDER — TRIAMCINOLONE ACETONIDE 55 MCG/ACT NA AERO
2.0000 | INHALATION_SPRAY | Freq: Every day | NASAL | 11 refills | Status: AC
Start: 2023-02-27 — End: ?
  Filled 2023-02-27: qty 10.8, 1d supply, fill #0
  Filled 2023-07-19: qty 16.9, 30d supply, fill #0
  Filled 2023-10-15: qty 16.9, 25d supply, fill #0
  Filled 2023-10-22: qty 16.9, 30d supply, fill #0

## 2023-02-27 MED FILL — Ipratropium Bromide Nasal Soln 0.06% (42 MCG/SPRAY): NASAL | 11 days supply | Qty: 15 | Fill #0 | Status: CN

## 2023-03-06 ENCOUNTER — Other Ambulatory Visit: Payer: Self-pay

## 2023-03-08 ENCOUNTER — Other Ambulatory Visit: Payer: Self-pay

## 2023-03-08 MED FILL — Ipratropium Bromide Nasal Soln 0.06% (42 MCG/SPRAY): NASAL | 11 days supply | Qty: 15 | Fill #0 | Status: AC

## 2023-03-13 DIAGNOSIS — F349 Persistent mood [affective] disorder, unspecified: Secondary | ICD-10-CM | POA: Diagnosis not present

## 2023-04-03 DIAGNOSIS — F349 Persistent mood [affective] disorder, unspecified: Secondary | ICD-10-CM | POA: Diagnosis not present

## 2023-05-29 DIAGNOSIS — F349 Persistent mood [affective] disorder, unspecified: Secondary | ICD-10-CM | POA: Diagnosis not present

## 2023-06-26 DIAGNOSIS — F349 Persistent mood [affective] disorder, unspecified: Secondary | ICD-10-CM | POA: Diagnosis not present

## 2023-07-10 DIAGNOSIS — F349 Persistent mood [affective] disorder, unspecified: Secondary | ICD-10-CM | POA: Diagnosis not present

## 2023-07-19 ENCOUNTER — Other Ambulatory Visit: Payer: Self-pay | Admitting: Nurse Practitioner

## 2023-07-19 ENCOUNTER — Other Ambulatory Visit: Payer: Self-pay

## 2023-07-20 ENCOUNTER — Other Ambulatory Visit: Payer: Self-pay

## 2023-07-20 MED FILL — Ipratropium Bromide Nasal Soln 0.06% (42 MCG/SPRAY): NASAL | 19 days supply | Qty: 15 | Fill #0 | Status: AC

## 2023-07-23 ENCOUNTER — Other Ambulatory Visit: Payer: Self-pay

## 2023-07-24 ENCOUNTER — Other Ambulatory Visit: Payer: Self-pay

## 2023-07-24 DIAGNOSIS — F349 Persistent mood [affective] disorder, unspecified: Secondary | ICD-10-CM | POA: Diagnosis not present

## 2023-07-24 MED ORDER — LORATADINE 10 MG PO TABS
10.0000 mg | ORAL_TABLET | Freq: Every day | ORAL | 3 refills | Status: AC | PRN
  Filled 2023-07-24: qty 100, 100d supply, fill #0

## 2023-08-14 DIAGNOSIS — F349 Persistent mood [affective] disorder, unspecified: Secondary | ICD-10-CM | POA: Diagnosis not present

## 2023-08-28 DIAGNOSIS — F349 Persistent mood [affective] disorder, unspecified: Secondary | ICD-10-CM | POA: Diagnosis not present

## 2023-09-18 DIAGNOSIS — F349 Persistent mood [affective] disorder, unspecified: Secondary | ICD-10-CM | POA: Diagnosis not present

## 2023-09-19 ENCOUNTER — Other Ambulatory Visit: Payer: Self-pay

## 2023-09-19 ENCOUNTER — Other Ambulatory Visit: Payer: Self-pay | Admitting: Nurse Practitioner

## 2023-09-19 DIAGNOSIS — E039 Hypothyroidism, unspecified: Secondary | ICD-10-CM

## 2023-09-19 MED ORDER — BUPROPION HCL ER (XL) 150 MG PO TB24
450.0000 mg | ORAL_TABLET | Freq: Every morning | ORAL | 3 refills | Status: DC
  Filled 2023-09-19: qty 270, 90d supply, fill #0

## 2023-09-19 MED ORDER — SERTRALINE HCL 100 MG PO TABS
100.0000 mg | ORAL_TABLET | Freq: Every morning | ORAL | 3 refills | Status: AC
  Filled 2023-09-19: qty 90, 90d supply, fill #0
  Filled 2023-10-15 – 2024-01-14 (×2): qty 90, 90d supply, fill #1
  Filled 2024-04-27: qty 90, 90d supply, fill #2

## 2023-09-20 ENCOUNTER — Other Ambulatory Visit: Payer: Self-pay

## 2023-09-20 ENCOUNTER — Other Ambulatory Visit: Payer: Self-pay | Admitting: Nurse Practitioner

## 2023-09-20 DIAGNOSIS — E039 Hypothyroidism, unspecified: Secondary | ICD-10-CM

## 2023-09-20 NOTE — Telephone Encounter (Signed)
 VM left informing pt to call and schedule an appt pt last appt was 10-04-22

## 2023-09-21 ENCOUNTER — Other Ambulatory Visit: Payer: Self-pay

## 2023-09-21 NOTE — Telephone Encounter (Signed)
 Vm left to CB to schedule ana ppt pt is over due for  follow up

## 2023-09-24 ENCOUNTER — Other Ambulatory Visit: Payer: Self-pay

## 2023-09-24 MED FILL — Levothyroxine Sodium Tab 25 MCG: ORAL | 90 days supply | Qty: 140 | Fill #0 | Status: CN

## 2023-10-02 DIAGNOSIS — F349 Persistent mood [affective] disorder, unspecified: Secondary | ICD-10-CM | POA: Diagnosis not present

## 2023-10-11 ENCOUNTER — Encounter: Payer: Self-pay | Admitting: Nurse Practitioner

## 2023-10-11 ENCOUNTER — Ambulatory Visit: Admitting: Nurse Practitioner

## 2023-10-11 VITALS — BP 130/86 | HR 72 | Temp 98.2°F | Ht 61.0 in | Wt 196.4 lb

## 2023-10-11 DIAGNOSIS — E559 Vitamin D deficiency, unspecified: Secondary | ICD-10-CM | POA: Diagnosis not present

## 2023-10-11 DIAGNOSIS — F419 Anxiety disorder, unspecified: Secondary | ICD-10-CM

## 2023-10-11 DIAGNOSIS — E039 Hypothyroidism, unspecified: Secondary | ICD-10-CM

## 2023-10-11 DIAGNOSIS — E785 Hyperlipidemia, unspecified: Secondary | ICD-10-CM | POA: Diagnosis not present

## 2023-10-11 DIAGNOSIS — I10 Essential (primary) hypertension: Secondary | ICD-10-CM

## 2023-10-11 DIAGNOSIS — H6993 Unspecified Eustachian tube disorder, bilateral: Secondary | ICD-10-CM

## 2023-10-11 DIAGNOSIS — E669 Obesity, unspecified: Secondary | ICD-10-CM | POA: Diagnosis not present

## 2023-10-11 LAB — COMPREHENSIVE METABOLIC PANEL WITH GFR
ALT: 19 U/L (ref 0–35)
AST: 18 U/L (ref 0–37)
Albumin: 4.5 g/dL (ref 3.5–5.2)
Alkaline Phosphatase: 46 U/L (ref 39–117)
BUN: 12 mg/dL (ref 6–23)
CO2: 27 meq/L (ref 19–32)
Calcium: 9.3 mg/dL (ref 8.4–10.5)
Chloride: 106 meq/L (ref 96–112)
Creatinine, Ser: 0.76 mg/dL (ref 0.40–1.20)
GFR: 84.56 mL/min (ref >60.00)
Glucose, Bld: 99 mg/dL (ref 70–99)
Potassium: 4 meq/L (ref 3.5–5.1)
Sodium: 141 meq/L (ref 135–145)
Total Bilirubin: 0.3 mg/dL (ref 0.2–1.2)
Total Protein: 6.9 g/dL (ref 6.0–8.3)

## 2023-10-11 LAB — CBC WITH DIFFERENTIAL/PLATELET
Basophils Absolute: 0.1 10*3/uL (ref 0.0–0.1)
Basophils Relative: 0.9 % (ref 0.0–3.0)
Eosinophils Absolute: 0.1 10*3/uL (ref 0.0–0.7)
Eosinophils Relative: 2.1 % (ref 0.0–5.0)
HCT: 38.2 % (ref 36.0–46.0)
Hemoglobin: 13.1 g/dL (ref 12.0–15.0)
Lymphocytes Relative: 39.5 % (ref 12.0–46.0)
Lymphs Abs: 2.4 10*3/uL (ref 0.7–4.0)
MCHC: 34.4 g/dL (ref 30.0–36.0)
MCV: 90.3 fl (ref 78.0–100.0)
Monocytes Absolute: 0.3 10*3/uL (ref 0.1–1.0)
Monocytes Relative: 5.2 % (ref 3.0–12.0)
Neutro Abs: 3.2 10*3/uL (ref 1.4–7.7)
Neutrophils Relative %: 52.3 % (ref 43.0–77.0)
Platelets: 267 10*3/uL (ref 150.0–400.0)
RBC: 4.23 Mil/uL (ref 3.87–5.11)
RDW: 13.6 % (ref 11.5–15.5)
WBC: 6.2 10*3/uL (ref 4.0–10.5)

## 2023-10-11 LAB — LDL CHOLESTEROL, DIRECT: Direct LDL: 147 mg/dL

## 2023-10-11 LAB — TSH: TSH: 2.87 u[IU]/mL (ref 0.35–5.50)

## 2023-10-11 LAB — VITAMIN B12: Vitamin B-12: 348 pg/mL (ref 211–911)

## 2023-10-11 LAB — LIPID PANEL
Cholesterol: 228 mg/dL — ABNORMAL HIGH (ref 0–200)
HDL: 44.2 mg/dL (ref >39.00)
NonHDL: 184.13
Total CHOL/HDL Ratio: 5
Triglycerides: 418 mg/dL — ABNORMAL HIGH (ref 0.0–149.0)
VLDL: 83.6 mg/dL — ABNORMAL HIGH (ref 0.0–40.0)

## 2023-10-11 LAB — HEMOGLOBIN A1C: Hgb A1c MFr Bld: 5.6 % (ref 4.6–6.5)

## 2023-10-11 LAB — VITAMIN D 25 HYDROXY (VIT D DEFICIENCY, FRACTURES): VITD: 19.31 ng/mL — ABNORMAL LOW (ref 30.00–100.00)

## 2023-10-11 NOTE — Progress Notes (Signed)
 Bluford Burkitt, NP-C Phone: [phone]  Michele Hammond is a 63 y.o. female who presents today for follow up.   Discussed the use of AI scribe software for clinical note transcription with the patient, who gave verbal consent to proceed.  History of Present Illness   Michele Hammond is a 63 year old female who presents for a follow-up visit.  She has not been checking her blood pressure at home due to needing a new cuff. Her current blood pressure is 130/86, which she attributes to being busy at work and consuming an espresso this morning. She continues to take metoprolol  daily. No chest pain, shortness of breath, dizziness, or swelling.  She continues to take levothyroxine  daily for her thyroid  condition. No issues with skin, hair, nails, heart palpitations, or temperature changes.  She is not currently on any cholesterol medication and describes her diet as 'not good,' expressing frustration with weight gain despite attempts to eat healthier. She feels she is retaining fluid and reports being exhausted all the time, even with increased sleep from 5-6 hours to 7-8 hours per night. She mentions falling asleep while watching TV, which is unusual for her.  She is currently on Zoloft  and Wellbutrin  for psychiatric care and plans to discuss her current stressors with her psychiatrist in an upcoming appointment. She is dealing with emotional stress related to her father's placement in a care home, which causes her to feel nauseous and guilty.  She describes her physical activity as walking [place] twice daily with her dog and taking care of her animals, but she lacks the energy to start a more structured exercise routine.  She reports ear pain, particularly in the left ear, and attributes it to allergies. She uses nasal spray and has been doing sinus rinses, although she has not done them in the past week. She feels like she has been punched in the nose and experiences facial pressure.       Social History   Tobacco Use  Smoking Status Never  Smokeless Tobacco Never    Current Outpatient Medications on File Prior to Visit  Medication Sig Dispense Refill   buPROPion  (WELLBUTRIN  XL) 150 MG 24 hr tablet Take 3 tablets (450 mg total) by mouth in the morning. 270 tablet 3   butalbital -acetaminophen -caffeine  (FIORICET ) 50-325-40 MG tablet Take 1-2 tablets by mouth 2 (two) times daily as needed for headache. 30 tablet 5   cetirizine -pseudoephedrine  (ZYRTEC -D) 5-120 MG tablet Take 1 tablet by mouth 2 (two) times daily. 30 tablet 2   ipratropium (ATROVENT ) 0.06 % nasal spray Place 2 sprays into both nostrils 4 (four) times daily. 15 mL 0   levothyroxine  (SYNTHROID ) 25 MCG tablet TAKE 1 & 1/2 TABLETS BY MOUTH DAILY BEFORE BREAKFAST 30 MINUTES BEFORE FOOD 140 tablet 3   loratadine  (CLARITIN ) 10 MG tablet Take 1 tablet (10 mg total) by mouth daily as needed for allergies. 100 tablet 3   metoprolol  succinate (TOPROL -XL) 25 MG 24 hr tablet Take 1 tablet (25 mg total) by mouth at bedtime. 90 tablet 3   Multiple Vitamin (MULTIVITAMIN) capsule Take 1 capsule by mouth daily.     ondansetron  (ZOFRAN -ODT) 4 MG disintegrating tablet Take 1 tablet (4 mg total) by mouth every 8 (eight) hours as needed for nausea or vomiting. 30 tablet 2   sertraline  (ZOLOFT ) 100 MG tablet Take 1 tablet (100 mg total) by mouth every morning. 90 tablet 3   triamcinolone  (NASACORT ) 55 MCG/ACT AERO nasal inhaler Place 2  sprays into the nose daily. 16.9 mL 11   pantoprazole  (PROTONIX ) 20 MG tablet Take 1 tablet (20 mg total) by mouth daily 30 minutes before food. 90 tablet 3   No current facility-administered medications on file prior to visit.     ROS see history of present illness  Objective  Physical Exam Vitals:   10/11/23 1122  BP: 130/86  Pulse: 72  Temp: 98.2 F (36.8 C)  SpO2: 96%    BP Readings from Last 3 Encounters:  10/11/23 130/86  10/04/22 (!) 120/90  08/14/22 139/87   Wt Readings from  Last 3 Encounters:  10/11/23 196 lb 6.4 oz (89.1 kg)  10/04/22 188 lb 6.4 oz (85.5 kg)  07/27/21 179 lb 3.2 oz (81.3 kg)    Physical Exam Constitutional:      General: She is not in acute distress.    Appearance: Normal appearance.  HENT:     Head: Normocephalic.     Right Ear: A middle ear effusion is present.     Left Ear: A middle ear effusion is present.     Nose: Nose normal.     Mouth/Throat:     Mouth: Mucous membranes are moist.     Pharynx: Oropharynx is clear.  Eyes:     Conjunctiva/sclera: Conjunctivae normal.     Pupils: Pupils are equal, round, and reactive to light.  Cardiovascular:     Rate and Rhythm: Normal rate and regular rhythm.     Heart sounds: Normal heart sounds.  Pulmonary:     Effort: Pulmonary effort is normal.     Breath sounds: Normal breath sounds.  Abdominal:     General: Abdomen is flat. Bowel sounds are normal.     Palpations: Abdomen is soft. There is no mass.     Tenderness: There is no abdominal tenderness.  Lymphadenopathy:     Cervical: No cervical adenopathy.  Skin:    General: Skin is warm and dry.  Neurological:     General: No focal deficit present.     Mental Status: She is alert.  Psychiatric:        Mood and Affect: Mood normal.        Behavior: Behavior normal.     Assessment/Plan: Please see individual problem list.  Dysfunction of both eustachian tubes Assessment & Plan: Clear fluid in ears is likely due to allergies, with the right ear being worse. She uses nasal spray and sinus rinses regularly. Recommend Flonase and azelastine nasal sprays. Consider a decongestant if congestion persists. Continue sinus rinses. Return precautions given to patient.    Primary hypertension Assessment & Plan: Blood pressure is 130/86 mmHg, slightly elevated, likely due to caffeine  and stress. She will continue Toprol  XL 25 mg daily. Encourage home blood pressure monitoring with a new cuff. Lab work as outlined.   Orders: -     CBC  with Differential/Platelet -     Comprehensive metabolic panel with GFR  Hypothyroidism, unspecified type Assessment & Plan: No symptoms reported. Currently taking Levothyroxine  25 mcg one and a half tablets daily. Continue. Will check TSH today.  Orders: -     TSH  Hyperlipidemia, unspecified hyperlipidemia type Assessment & Plan: Patient declines starting on statin medication. She wants to try and control it with her diet. Will check lipids today. The 10-year ASCVD risk score (Arnett DK, et al., 2019) is: 6.4%. Encouraged healthy diet and exercise.   Orders: -     Lipid panel -  LDL cholesterol, direct  Anxiety Assessment & Plan: Adequately controlled with Zoloft  and Wellbutrin . Stress from family issues is present. Plans to discuss with a psychiatrist. Continue psychiatric medications. Follow up with psychiatry in one week. Encourage stress management and self-care. PHQ- 9 and GAD- 3 today.   Orders: -     Vitamin B12  Obesity (BMI 30-39.9) -     Hemoglobin A1c  Vitamin D  deficiency -     VITAMIN D  25 Hydroxy (Vit-D Deficiency, Fractures)    Return in about 6 months (around 04/11/2024) for Follow up.   Bluford Burkitt, NP-C [facility]

## 2023-10-12 ENCOUNTER — Encounter: Payer: Self-pay | Admitting: Nurse Practitioner

## 2023-10-15 ENCOUNTER — Encounter: Payer: Self-pay | Admitting: Nurse Practitioner

## 2023-10-15 ENCOUNTER — Other Ambulatory Visit: Payer: Self-pay | Admitting: Nurse Practitioner

## 2023-10-15 ENCOUNTER — Other Ambulatory Visit (HOSPITAL_COMMUNITY): Payer: Self-pay

## 2023-10-15 DIAGNOSIS — H6993 Unspecified Eustachian tube disorder, bilateral: Secondary | ICD-10-CM | POA: Insufficient documentation

## 2023-10-15 DIAGNOSIS — I10 Essential (primary) hypertension: Secondary | ICD-10-CM

## 2023-10-15 MED ORDER — METOPROLOL SUCCINATE ER 25 MG PO TB24
25.0000 mg | ORAL_TABLET | Freq: Every day | ORAL | 3 refills | Status: AC
  Filled 2023-10-15 – 2023-10-22 (×2): qty 90, 90d supply, fill #0
  Filled 2024-01-14: qty 90, 90d supply, fill #1
  Filled 2024-04-27: qty 90, 90d supply, fill #2

## 2023-10-15 MED FILL — Levothyroxine Sodium Tab 25 MCG: ORAL | 90 days supply | Qty: 140 | Fill #0 | Status: CN

## 2023-10-15 NOTE — Assessment & Plan Note (Addendum)
 Clear fluid in ears is likely due to allergies, with the right ear being worse. She uses nasal spray and sinus rinses regularly. Recommend Flonase and azelastine nasal sprays. Consider a decongestant if congestion persists. Continue sinus rinses. Return precautions given to patient.

## 2023-10-15 NOTE — Assessment & Plan Note (Signed)
 Patient declines starting on statin medication. She wants to try and control it with her diet. Will check lipids today. The 10-year ASCVD risk score (Arnett DK, et al., 2019) is: 6.4%. Encouraged healthy diet and exercise.

## 2023-10-15 NOTE — Assessment & Plan Note (Signed)
 Blood pressure is 130/86 mmHg, slightly elevated, likely due to caffeine  and stress. She will continue Toprol  XL 25 mg daily. Encourage home blood pressure monitoring with a new cuff. Lab work as outlined.

## 2023-10-15 NOTE — Assessment & Plan Note (Signed)
 No symptoms reported. Currently taking Levothyroxine  25 mcg one and a half tablets daily. Continue. Will check TSH today.

## 2023-10-15 NOTE — Assessment & Plan Note (Signed)
 Adequately controlled with Zoloft  and Wellbutrin . Stress from family issues is present. Plans to discuss with a psychiatrist. Continue psychiatric medications. Follow up with psychiatry in one week. Encourage stress management and self-care. PHQ- 9 and GAD- 3 today.

## 2023-10-16 ENCOUNTER — Other Ambulatory Visit (HOSPITAL_COMMUNITY): Payer: Self-pay

## 2023-10-22 ENCOUNTER — Other Ambulatory Visit (HOSPITAL_COMMUNITY): Payer: Self-pay

## 2023-10-22 ENCOUNTER — Other Ambulatory Visit: Payer: Self-pay

## 2023-10-22 MED FILL — Levothyroxine Sodium Tab 25 MCG: ORAL | 30 days supply | Qty: 45 | Fill #0 | Status: AC

## 2023-10-23 DIAGNOSIS — F349 Persistent mood [affective] disorder, unspecified: Secondary | ICD-10-CM | POA: Diagnosis not present

## 2023-10-25 ENCOUNTER — Other Ambulatory Visit (HOSPITAL_COMMUNITY): Payer: Self-pay

## 2023-10-26 ENCOUNTER — Other Ambulatory Visit (HOSPITAL_COMMUNITY): Payer: Self-pay

## 2023-11-06 DIAGNOSIS — F349 Persistent mood [affective] disorder, unspecified: Secondary | ICD-10-CM | POA: Diagnosis not present

## 2023-11-20 DIAGNOSIS — F349 Persistent mood [affective] disorder, unspecified: Secondary | ICD-10-CM | POA: Diagnosis not present

## 2023-12-03 MED FILL — Levothyroxine Sodium Tab 25 MCG: ORAL | 30 days supply | Qty: 45 | Fill #1 | Status: AC

## 2023-12-11 DIAGNOSIS — F349 Persistent mood [affective] disorder, unspecified: Secondary | ICD-10-CM | POA: Diagnosis not present

## 2023-12-17 ENCOUNTER — Telehealth: Admitting: Physician Assistant

## 2023-12-17 DIAGNOSIS — J069 Acute upper respiratory infection, unspecified: Secondary | ICD-10-CM | POA: Diagnosis not present

## 2023-12-17 DIAGNOSIS — B9689 Other specified bacterial agents as the cause of diseases classified elsewhere: Secondary | ICD-10-CM

## 2023-12-17 MED ORDER — PROMETHAZINE-DM 6.25-15 MG/5ML PO SYRP: 5.0000 mL | ORAL_SOLUTION | Freq: Four times a day (QID) | ORAL | 0 refills | Status: DC | PRN

## 2023-12-17 MED ORDER — PREDNISONE 20 MG PO TABS: 40.0000 mg | ORAL_TABLET | Freq: Every day | ORAL | 0 refills | Status: DC

## 2023-12-17 MED ORDER — DOXYCYCLINE HYCLATE 100 MG PO TABS: 100.0000 mg | ORAL_TABLET | Freq: Two times a day (BID) | ORAL | 0 refills | Status: DC

## 2023-12-17 NOTE — Patient Instructions (Signed)
 Michele Hammond, thank you for joining Delon CHRISTELLA Dickinson, PA-C for today's virtual visit.  While this provider is not your primary care provider (PCP), if your PCP is located in our provider database this encounter information will be shared with them immediately following your visit.   A [facility] MyChart account gives you access to today's visit and all your visits, tests, and labs performed at [facility]  click here if you don't have a [facility] MyChart account or go to [email or web address]  Consent: (Patient) Michele Hammond provided verbal consent for this virtual visit at the beginning of the encounter.  Current Medications:  Current Outpatient Medications:    doxycycline  (VIBRA -TABS) 100 MG tablet, Take 1 tablet (100 mg total) by mouth 2 (two) times daily., Disp: 20 tablet, Rfl: 0   predniSONE  (DELTASONE ) 20 MG tablet, Take 2 tablets (40 mg total) by mouth daily with breakfast., Disp: 10 tablet, Rfl: 0   promethazine-dextromethorphan (PROMETHAZINE-DM) 6.25-15 MG/5ML syrup, Take 5 mLs by mouth 4 (four) times daily as needed., Disp: 118 mL, Rfl: 0   buPROPion  (WELLBUTRIN  XL) 150 MG 24 hr tablet, Take 3 tablets (450 mg total) by mouth in the morning., Disp: 270 tablet, Rfl: 3   butalbital -acetaminophen -caffeine  (FIORICET ) 50-325-40 MG tablet, Take 1-2 tablets by mouth 2 (two) times daily as needed for headache., Disp: 30 tablet, Rfl: 5   cetirizine -pseudoephedrine  (ZYRTEC -D) 5-120 MG tablet, Take 1 tablet by mouth 2 (two) times daily., Disp: 30 tablet, Rfl: 2   ipratropium (ATROVENT ) 0.06 % nasal spray, Place 2 sprays into both nostrils 4 (four) times daily., Disp: 15 mL, Rfl: 0   levothyroxine  (SYNTHROID ) 25 MCG tablet, Take 1.5 tablets (37.5 mcg total) by mouth daily before breakfast 30 minutes before food., Disp: 45 tablet, Rfl: 11   loratadine  (CLARITIN ) 10 MG tablet, Take 1 tablet (10 mg total) by mouth daily as needed for allergies., Disp: 100 tablet, Rfl: 3   metoprolol   succinate (TOPROL -XL) 25 MG 24 hr tablet, Take 1 tablet (25 mg total) by mouth at bedtime., Disp: 90 tablet, Rfl: 3   Multiple Vitamin (MULTIVITAMIN) capsule, Take 1 capsule by mouth daily., Disp: , Rfl:    ondansetron  (ZOFRAN -ODT) 4 MG disintegrating tablet, Take 1 tablet (4 mg total) by mouth every 8 (eight) hours as needed for nausea or vomiting., Disp: 30 tablet, Rfl: 2   pantoprazole  (PROTONIX ) 20 MG tablet, Take 1 tablet (20 mg total) by mouth daily 30 minutes before food., Disp: 90 tablet, Rfl: 3   sertraline  (ZOLOFT ) 100 MG tablet, Take 1 tablet (100 mg total) by mouth every morning., Disp: 90 tablet, Rfl: 3   triamcinolone  (NASACORT ) 55 MCG/ACT AERO nasal inhaler, Place 2 sprays into the nose daily., Disp: 16.9 mL, Rfl: 11   Medications ordered in this encounter:  Meds ordered this encounter  Medications   doxycycline  (VIBRA -TABS) 100 MG tablet    Sig: Take 1 tablet (100 mg total) by mouth 2 (two) times daily.    Dispense:  20 tablet    Refill:  0    Supervising Provider:   LAMPTEY, PHILIP O [[id number]]   promethazine-dextromethorphan (PROMETHAZINE-DM) 6.25-15 MG/5ML syrup    Sig: Take 5 mLs by mouth 4 (four) times daily as needed.    Dispense:  118 mL    Refill:  0    Supervising Provider:   BLAISE ALEENE KIDD [[id number]]   predniSONE  (DELTASONE ) 20 MG tablet    Sig: Take 2 tablets (40 mg total) by mouth daily with  breakfast.    Dispense:  10 tablet    Refill:  0    Supervising Provider:   BLAISE ALEENE KIDD [[id number]]     *If you need refills on other medications prior to your next appointment, please contact your pharmacy*  Follow-Up: Call back or seek an in-person evaluation if the symptoms worsen or if the condition fails to improve as anticipated.  [facility] [phone]  Other Instructions  Upper Respiratory Infection, Adult An upper respiratory infection (URI) is a common viral infection of the nose, throat, and upper air passages that lead to the  lungs. The most common type of URI is the common cold. URIs usually get better on their own, without medical treatment. What are the causes? A URI is caused by a virus. You may catch a virus by: Breathing in droplets from an infected person's cough or sneeze. Touching something that has been exposed to the virus (is contaminated) and then touching your mouth, nose, or eyes. What increases the risk? You are more likely to get a URI if: You are very young or very old. You have close contact with others, such as at work, school, or a health care facility. You smoke. You have long-term (chronic) heart or lung disease. You have a weakened disease-fighting system (immune system). You have nasal allergies or asthma. You are experiencing a lot of stress. You have poor nutrition. What are the signs or symptoms? A URI usually involves some of the following symptoms: Runny or stuffy (congested) nose. Cough. Sneezing. Sore throat. Headache. Fatigue. Fever. Loss of appetite. Pain in your forehead, behind your eyes, and over your cheekbones (sinus pain). Muscle aches. Redness or irritation of the eyes. Pressure in the ears or face. How is this diagnosed? This condition may be diagnosed based on your medical history and symptoms, and a physical exam. Your health care provider may use a swab to take a mucus sample from your nose (nasal swab). This sample can be tested to determine what virus is causing the illness. How is this treated? URIs usually get better on their own within 7-10 days. Medicines cannot cure URIs, but your health care provider may recommend certain medicines to help relieve symptoms, such as: Over-the-counter cold medicines. Cough suppressants. Coughing is a type of defense against infection that helps to clear the respiratory system, so take these medicines only as recommended by your health care provider. Fever-reducing medicines. Follow these instructions at  home: Activity Rest as needed. If you have a fever, stay home from work or school until your fever is gone or until your health care provider says your URI cannot spread to other people (is no longer contagious). Your health care provider may have you wear a face mask to prevent your infection from spreading. Relieving symptoms Gargle with a mixture of salt and water 3-4 times a day or as needed. To make salt water, completely dissolve -1 tsp (3-6 g) of salt in 1 cup (237 mL) of warm water. Use a cool-mist humidifier to add moisture to the air. This can help you breathe more easily. Eating and drinking  Drink enough fluid to keep your urine pale yellow. Eat soups and other clear broths. General instructions  Take over-the-counter and prescription medicines only as told by your health care provider. These include cold medicines, fever reducers, and cough suppressants. Do not use any products that contain nicotine or tobacco. These products include cigarettes, chewing tobacco, and vaping devices, such as e-cigarettes. If  you need help quitting, ask your health care provider. Stay away from secondhand smoke. Stay up to date on all immunizations, including the yearly (annual) flu vaccine. Keep all follow-up visits. This is important. How to prevent the spread of infection to others URIs can be contagious. To prevent the infection from spreading: Wash your hands with soap and water for at least 20 seconds. If soap and water are not available, use hand sanitizer. Avoid touching your mouth, face, eyes, or nose. Cough or sneeze into a tissue or your sleeve or elbow instead of into your hand or into the air.  Contact a health care provider if: You are getting worse instead of better. You have a fever or chills. Your mucus is brown or red. You have yellow or brown discharge coming from your nose. You have pain in your face, especially when you bend forward. You have swollen neck glands. You  have pain while swallowing. You have white areas in the back of your throat. Get help right away if: You have shortness of breath that gets worse. You have severe or persistent: Headache. Ear pain. Sinus pain. Chest pain. You have chronic lung disease along with any of the following: Making high-pitched whistling sounds when you breathe, most often when you breathe out (wheezing). Prolonged cough (more than 14 days). Coughing up blood. A change in your usual mucus. You have a stiff neck. You have changes in your: Vision. Hearing. Thinking. Mood. These symptoms may be an emergency. Get help right away. Call 911. Do not wait to see if the symptoms will go away. Do not drive yourself to the hospital. Summary An upper respiratory infection (URI) is a common infection of the nose, throat, and upper air passages that lead to the lungs. A URI is caused by a virus. URIs usually get better on their own within 7-10 days. Medicines cannot cure URIs, but your health care provider may recommend certain medicines to help relieve symptoms. This information is not intended to replace advice given to you by your health care provider. Make sure you discuss any questions you have with your health care provider. Document Revised: 01/05/2021 Document Reviewed: 01/05/2021 Elsevier Patient Education  2024 [employer]   If you have been instructed to have an in-person evaluation today at a local Urgent Care facility, please use the link below. It will take you to a list of all of our available [facility], including address, phone number and hours of operation. Please do not delay care.  [facility]  If you or a family member do not have a primary care provider, use the link below to schedule a visit and establish care. When you choose a [facility] primary care physician or advanced practice provider, you gain a long-term partner in health. Find a Primary Care  Provider  Learn more about [facility]'s in-office and virtual care options: [facility] - Get Care Now

## 2023-12-17 NOTE — Progress Notes (Signed)
 Virtual Visit Consent   [name], you are scheduled for a virtual visit with a [facility] provider today. Just as with appointments in the office, your consent must be obtained to participate. Your consent will be active for this visit and any virtual visit you may have with one of our providers in the next 365 days. If you have a MyChart account, a copy of this consent can be sent to you electronically.  As this is a virtual visit, video technology does not allow for your provider to perform a traditional examination. This may limit your provider's ability to fully assess your condition. If your provider identifies any concerns that need to be evaluated in person or the need to arrange testing (such as labs, EKG, etc.), we will make arrangements to do so. Although advances in technology are sophisticated, we cannot ensure that it will always work on either your end or our end. If the connection with a video visit is poor, the visit may have to be switched to a telephone visit. With either a video or telephone visit, we are not always able to ensure that we have a secure connection.  By engaging in this virtual visit, you consent to the provision of healthcare and authorize for your insurance to be billed (if applicable) for the services provided during this visit. Depending on your insurance coverage, you may receive a charge related to this service.  I need to obtain your verbal consent now. Are you willing to proceed with your visit today? Michele Hammond has provided verbal consent on 12/17/2023 for a virtual visit (video or telephone). Delon CHRISTELLA Dickinson, PA-C  Date: 12/17/2023 12:51 PM   Virtual Visit via Video Note   I, Delon CHRISTELLA Dickinson, connected with  NATACHA JEPSEN  ([id number], [birth year]) on 12/17/23 at 12:45 PM EDT by a video-enabled telemedicine application and verified that I am speaking with the correct person using two identifiers.  Location: Patient: Virtual Visit Location Patient:  Home Provider: Virtual Visit Location Provider: Home Office   I discussed the limitations of evaluation and management by telemedicine and the availability of in person appointments. The patient expressed understanding and agreed to proceed.    History of Present Illness: Michele Hammond is a 63 y.o. who identifies as a female who was assigned female at birth, and is being seen today for flu-like illness with fevers.  HPI: URI  This is a new problem. The current episode started in the past 7 days (5 days; had sick contact from grandson 2 weekends ago). The problem has been gradually worsening. The maximum temperature recorded prior to her arrival was 103 - 104 F (104.1 highest on Thursday night, lowest is 99). Associated symptoms include congestion, coughing, diarrhea, headaches, rhinorrhea, sinus pain and a sore throat. Pertinent negatives include no chest pain, ear pain, nausea, plugged ear sensation or wheezing. Associated symptoms comments: Body aches, whistling in nasal passageways. She has tried acetaminophen  and NSAIDs for the symptoms. The treatment provided no relief.     Problems:  Patient Active Problem List   Diagnosis Date Noted   Dysfunction of both eustachian tubes 10/15/2023   Obesity (BMI 30-39.9) 10/04/2022   Tinnitus 01/11/2020   Dizziness 01/11/2020   Intracranial atherosclerosis 12/29/2019   Vitamin D  deficiency 12/29/2019   Abnormality of gait due to impairment of balance 12/19/2019   Abdominal aortic aneurysm (AAA) without rupture (HCC) 12/19/2019   Plantar fasciitis 12/18/2019   Hypothyroidism 09/15/2019   Gastroesophageal reflux  disease without esophagitis 09/15/2019   Anxiety 09/15/2019   Stress 09/15/2019   Annual physical exam 12/10/2017   HLD (hyperlipidemia) 12/10/2017   Fatty liver 11/15/2017   HTN (hypertension) 11/15/2017   History of hepatitis C 11/15/2017   Calculus of gallbladder without cholecystitis without obstruction    Ulcer, cervix 07/25/2013     Allergies:  Allergies  Allergen Reactions   Amoxicillin Rash   Penicillin G Rash   Sulfa Antibiotics Rash   Medications:  Current Outpatient Medications:    doxycycline  (VIBRA -TABS) 100 MG tablet, Take 1 tablet (100 mg total) by mouth 2 (two) times daily., Disp: 20 tablet, Rfl: 0   predniSONE  (DELTASONE ) 20 MG tablet, Take 2 tablets (40 mg total) by mouth daily with breakfast., Disp: 10 tablet, Rfl: 0   promethazine-dextromethorphan (PROMETHAZINE-DM) 6.25-15 MG/5ML syrup, Take 5 mLs by mouth 4 (four) times daily as needed., Disp: 118 mL, Rfl: 0   buPROPion  (WELLBUTRIN  XL) 150 MG 24 hr tablet, Take 3 tablets (450 mg total) by mouth in the morning., Disp: 270 tablet, Rfl: 3   butalbital -acetaminophen -caffeine  (FIORICET ) 50-325-40 MG tablet, Take 1-2 tablets by mouth 2 (two) times daily as needed for headache., Disp: 30 tablet, Rfl: 5   cetirizine -pseudoephedrine  (ZYRTEC -D) 5-120 MG tablet, Take 1 tablet by mouth 2 (two) times daily., Disp: 30 tablet, Rfl: 2   ipratropium (ATROVENT ) 0.06 % nasal spray, Place 2 sprays into both nostrils 4 (four) times daily., Disp: 15 mL, Rfl: 0   levothyroxine  (SYNTHROID ) 25 MCG tablet, Take 1.5 tablets (37.5 mcg total) by mouth daily before breakfast 30 minutes before food., Disp: 45 tablet, Rfl: 11   loratadine  (CLARITIN ) 10 MG tablet, Take 1 tablet (10 mg total) by mouth daily as needed for allergies., Disp: 100 tablet, Rfl: 3   metoprolol  succinate (TOPROL -XL) 25 MG 24 hr tablet, Take 1 tablet (25 mg total) by mouth at bedtime., Disp: 90 tablet, Rfl: 3   Multiple Vitamin (MULTIVITAMIN) capsule, Take 1 capsule by mouth daily., Disp: , Rfl:    ondansetron  (ZOFRAN -ODT) 4 MG disintegrating tablet, Take 1 tablet (4 mg total) by mouth every 8 (eight) hours as needed for nausea or vomiting., Disp: 30 tablet, Rfl: 2   pantoprazole  (PROTONIX ) 20 MG tablet, Take 1 tablet (20 mg total) by mouth daily 30 minutes before food., Disp: 90 tablet, Rfl: 3   sertraline   (ZOLOFT ) 100 MG tablet, Take 1 tablet (100 mg total) by mouth every morning., Disp: 90 tablet, Rfl: 3   triamcinolone  (NASACORT ) 55 MCG/ACT AERO nasal inhaler, Place 2 sprays into the nose daily., Disp: 16.9 mL, Rfl: 11  Observations/Objective: Patient is well-developed, well-nourished in no acute distress.  Resting comfortably at home.  Head is normocephalic, atraumatic.  No labored breathing.  Speech is clear and coherent with logical content.  Patient is alert and oriented at baseline.    Assessment and Plan: 1. Bacterial upper respiratory infection (Primary) - doxycycline  (VIBRA -TABS) 100 MG tablet; Take 1 tablet (100 mg total) by mouth 2 (two) times daily.  Dispense: 20 tablet; Refill: 0 - promethazine-dextromethorphan (PROMETHAZINE-DM) 6.25-15 MG/5ML syrup; Take 5 mLs by mouth 4 (four) times daily as needed.  Dispense: 118 mL; Refill: 0 - predniSONE  (DELTASONE ) 20 MG tablet; Take 2 tablets (40 mg total) by mouth daily with breakfast.  Dispense: 10 tablet; Refill: 0  - Worsening despite OTC medications - Suspect initially possibly Covid or Flu, but symptoms not improving and gradually worsening - Will treat with Doxycycline  and Prednisone  - Promethazine DM for cough -  Can add Mucinex (PLAIN) - Push fluids.  - Rest.  - Steam and humidifier can help - Seek in person evaluation if worsening or symptoms fail to improve    Follow Up Instructions: I discussed the assessment and treatment plan with the patient. The patient was provided an opportunity to ask questions and all were answered. The patient agreed with the plan and demonstrated an understanding of the instructions.  A copy of instructions were sent to the patient via MyChart unless otherwise noted below.    The patient was advised to call back or seek an in-person evaluation if the symptoms worsen or if the condition fails to improve as anticipated.    Delon CHRISTELLA Dickinson, PA-C

## 2024-01-01 DIAGNOSIS — F349 Persistent mood [affective] disorder, unspecified: Secondary | ICD-10-CM | POA: Diagnosis not present

## 2024-01-14 ENCOUNTER — Other Ambulatory Visit: Payer: Self-pay

## 2024-01-14 MED FILL — Levothyroxine Sodium Tab 25 MCG: ORAL | 30 days supply | Qty: 45 | Fill #2 | Status: AC

## 2024-02-19 DIAGNOSIS — F349 Persistent mood [affective] disorder, unspecified: Secondary | ICD-10-CM | POA: Diagnosis not present

## 2024-02-21 ENCOUNTER — Other Ambulatory Visit: Payer: Self-pay

## 2024-02-21 ENCOUNTER — Other Ambulatory Visit: Payer: Self-pay | Admitting: Nurse Practitioner

## 2024-02-21 DIAGNOSIS — F419 Anxiety disorder, unspecified: Secondary | ICD-10-CM

## 2024-02-21 MED ORDER — BUPROPION HCL ER (XL) 150 MG PO TB24
450.0000 mg | ORAL_TABLET | Freq: Every morning | ORAL | 3 refills | Status: AC
Start: 2024-02-21 — End: ?
  Filled 2024-02-21: qty 270, 90d supply, fill #0

## 2024-02-21 MED FILL — Levothyroxine Sodium Tab 25 MCG: ORAL | 30 days supply | Qty: 45 | Fill #3 | Status: AC

## 2024-03-04 DIAGNOSIS — F349 Persistent mood [affective] disorder, unspecified: Secondary | ICD-10-CM | POA: Diagnosis not present

## 2024-03-18 ENCOUNTER — Ambulatory Visit: Admitting: Nurse Practitioner

## 2024-03-18 VITALS — BP 120/88 | HR 69 | Temp 98.0°F | Ht 62.0 in | Wt 197.8 lb

## 2024-03-18 DIAGNOSIS — Z0001 Encounter for general adult medical examination with abnormal findings: Secondary | ICD-10-CM

## 2024-03-18 DIAGNOSIS — S50861A Insect bite (nonvenomous) of right forearm, initial encounter: Secondary | ICD-10-CM | POA: Diagnosis not present

## 2024-03-18 DIAGNOSIS — F32A Depression, unspecified: Secondary | ICD-10-CM | POA: Diagnosis not present

## 2024-03-18 DIAGNOSIS — W57XXXA Bitten or stung by nonvenomous insect and other nonvenomous arthropods, initial encounter: Secondary | ICD-10-CM

## 2024-03-18 DIAGNOSIS — E669 Obesity, unspecified: Secondary | ICD-10-CM | POA: Diagnosis not present

## 2024-03-18 DIAGNOSIS — E039 Hypothyroidism, unspecified: Secondary | ICD-10-CM | POA: Diagnosis not present

## 2024-03-18 DIAGNOSIS — I10 Essential (primary) hypertension: Secondary | ICD-10-CM

## 2024-03-18 DIAGNOSIS — E559 Vitamin D deficiency, unspecified: Secondary | ICD-10-CM

## 2024-03-18 DIAGNOSIS — F419 Anxiety disorder, unspecified: Secondary | ICD-10-CM | POA: Diagnosis not present

## 2024-03-18 DIAGNOSIS — Z1231 Encounter for screening mammogram for malignant neoplasm of breast: Secondary | ICD-10-CM | POA: Diagnosis not present

## 2024-03-18 LAB — BASIC METABOLIC PANEL WITH GFR
BUN: 7 mg/dL (ref 6–23)
CO2: 26 meq/L (ref 19–32)
Calcium: 9.9 mg/dL (ref 8.4–10.5)
Chloride: 102 meq/L (ref 96–112)
Creatinine, Ser: 0.71 mg/dL (ref 0.40–1.20)
GFR: 91.47 mL/min (ref >60.00)
Glucose, Bld: 92 mg/dL (ref 70–99)
Potassium: 4.5 meq/L (ref 3.5–5.1)
Sodium: 139 meq/L (ref 135–145)

## 2024-03-18 LAB — TSH: TSH: 3.38 u[IU]/mL (ref 0.35–5.50)

## 2024-03-18 LAB — VITAMIN D 25 HYDROXY (VIT D DEFICIENCY, FRACTURES): VITD: 25.26 ng/mL — ABNORMAL LOW (ref 30.00–100.00)

## 2024-03-18 NOTE — Assessment & Plan Note (Signed)
 Her hypothyroidism is well-managed on Synthroid . Plan to recheck thyroid  function today. Continue Synthroid  as prescribed and check TSH.

## 2024-03-18 NOTE — Assessment & Plan Note (Addendum)
 Her diastolic blood pressure was elevated initially, improvement noted on second reading. Managed with Toprol  XL 25 mg daily. Continue metoprolol  as prescribed and monitor blood pressure regularly. Check BMP.

## 2024-03-18 NOTE — Assessment & Plan Note (Signed)
 She has previously low vitamin D  levels. Not taking a supplement. Check vitamin D  level.

## 2024-03-18 NOTE — Patient Instructions (Signed)
 YOUR MAMMOGRAM IS DUE, PLEASE CALL AND GET THIS SCHEDULED! [facility] - call [phone]

## 2024-03-18 NOTE — Assessment & Plan Note (Signed)
 She has an insect bite with swelling, itching, and warmth, but no significant progression. Continue oral antihistamines, apply topical Benadryl gel for itching, use ice for swelling, and monitor for changes or worsening.

## 2024-03-18 NOTE — Assessment & Plan Note (Signed)
 Physical exam complete. We will check lab work as outlined. Pap smear and colonoscopy are up to date. She is due for a mammogram. Flu and tetanus vaccines are up to date. She declined the shingles vaccine and additional COVID vaccines. She uses alcohol socially and does not smoke or use drugs. Her activity level has decreased due to past illness and stress. Order a mammogram. Encourage a low carb, high protein diet, regular exercise, and adequate fluid intake. Continue routine dental and eye exams. Return to care in 6 months, sooner as needed.

## 2024-03-18 NOTE — Assessment & Plan Note (Signed)
 She experiences increased stress from personal events, but Zoloft  and Wellbutrin  are effective. She has regular psychiatric follow-ups. Continue Zoloft  and Wellbutrin  as prescribed and follow up with the psychiatrist as needed.

## 2024-03-18 NOTE — Progress Notes (Signed)
 Leron Glance, NP-C Phone: [phone]  Michele Hammond is a 63 y.o. female who presents today for annual exam.   Discussed the use of AI scribe software for clinical note transcription with the patient, who gave verbal consent to proceed.  History of Present Illness   Michele Hammond is a 63 year old female who presents for an annual physical exam.  She experienced a sting or bite on Sunday, resulting in swelling by Monday morning. The affected area is itchy, and she has been using OTC antihistamines for relief.   At the end of June, she had a significant illness with a fever reaching 104.45F, for which she had a telehealth visit and was prescribed doxycycline  and prednisone . Since then, she has not felt like herself and has not resumed her usual exercise routine. The recent passing of her father on August 23rd and her daughter's mental health issues have added stress to her life.  She is currently taking metoprolol  almost daily for blood pressure and Synthroid  for thyroid  management. She is also on Zoloft  and Wellbutrin  for mood, anxiety, and depression, which she feels are helpful. Despite not being very active and not eating poorly, she reports ongoing weight gain.  She has a history of IBS, lactose and fructose intolerance, and experiences intermittent diarrhea. She recalls having gallbladder surgery and was told she had a hernia, possibly umbilical. She experiences occasional headaches and fluid retention in her legs when tired.  She reports difficulty with sleep, either falling asleep or staying asleep, and is working with a psychiatrist to address this. She started seeing a psychiatrist after her best friend's suicide.  No chest pain, shortness of breath, abdominal pain, trouble swallowing, burning during urination, and dizziness. She occasionally experiences headaches and fluid retention in her legs when tired.      Social History   Tobacco Use  Smoking Status Never  Smokeless Tobacco  Never    Current Outpatient Medications on File Prior to Visit  Medication Sig Dispense Refill   buPROPion  (WELLBUTRIN  XL) 150 MG 24 hr tablet Take 3 tablets (450 mg total) by mouth in the morning. 270 tablet 3   butalbital -acetaminophen -caffeine  (FIORICET ) 50-325-40 MG tablet Take 1-2 tablets by mouth 2 (two) times daily as needed for headache. 30 tablet 5   cetirizine -pseudoephedrine  (ZYRTEC -D) 5-120 MG tablet Take 1 tablet by mouth 2 (two) times daily. 30 tablet 2   ipratropium (ATROVENT ) 0.06 % nasal spray Place 2 sprays into both nostrils 4 (four) times daily. 15 mL 0   levothyroxine  (SYNTHROID ) 25 MCG tablet Take 1.5 tablets (37.5 mcg total) by mouth daily before breakfast 30 minutes before food. 45 tablet 11   loratadine  (CLARITIN ) 10 MG tablet Take 1 tablet (10 mg total) by mouth daily as needed for allergies. 100 tablet 3   metoprolol  succinate (TOPROL -XL) 25 MG 24 hr tablet Take 1 tablet (25 mg total) by mouth at bedtime. 90 tablet 3   Multiple Vitamin (MULTIVITAMIN) capsule Take 1 capsule by mouth daily.     ondansetron  (ZOFRAN -ODT) 4 MG disintegrating tablet Take 1 tablet (4 mg total) by mouth every 8 (eight) hours as needed for nausea or vomiting. 30 tablet 2   pantoprazole  (PROTONIX ) 20 MG tablet Take 1 tablet (20 mg total) by mouth daily 30 minutes before food. 90 tablet 3   sertraline  (ZOLOFT ) 100 MG tablet Take 1 tablet (100 mg total) by mouth every morning. 90 tablet 3   triamcinolone  (NASACORT ) 55 MCG/ACT AERO nasal inhaler Place 2 sprays  into the nose daily. 16.9 mL 11   No current facility-administered medications on file prior to visit.     ROS see history of present illness  Objective  Physical Exam Vitals:   03/18/24 1309 03/18/24 1335  BP: (!) 126/92 120/88  Pulse: 69   Temp: 98 F (36.7 C)   SpO2: 98%     BP Readings from Last 3 Encounters:  03/18/24 120/88  10/11/23 130/86  10/04/22 (!) 120/90   Wt Readings from Last 3 Encounters:  03/18/24 197 lb  12.8 oz (89.7 kg)  10/11/23 196 lb 6.4 oz (89.1 kg)  10/04/22 188 lb 6.4 oz (85.5 kg)    Physical Exam Constitutional:      General: She is not in acute distress.    Appearance: Normal appearance. She is obese.  HENT:     Head: Normocephalic.     Right Ear: Tympanic membrane normal.     Left Ear: Tympanic membrane normal.     Nose: Nose normal.     Mouth/Throat:     Mouth: Mucous membranes are moist.     Pharynx: Oropharynx is clear.  Eyes:     Conjunctiva/sclera: Conjunctivae normal.     Pupils: Pupils are equal, round, and reactive to light.  Neck:     Thyroid : No thyromegaly.  Cardiovascular:     Rate and Rhythm: Normal rate and regular rhythm.     Heart sounds: Normal heart sounds.  Pulmonary:     Effort: Pulmonary effort is normal.     Breath sounds: Normal breath sounds.  Abdominal:     General: Abdomen is flat. Bowel sounds are normal.     Palpations: Abdomen is soft. There is no mass.     Tenderness: There is no abdominal tenderness.  Musculoskeletal:        General: Normal range of motion.  Lymphadenopathy:     Cervical: No cervical adenopathy.  Skin:    General: Skin is warm and dry.     Findings: Erythema and lesion (small insect bite noted on right forearm. Warmth and erythema present. No drainage.) present. No rash.  Neurological:     General: No focal deficit present.     Mental Status: She is alert.  Psychiatric:        Mood and Affect: Mood normal.        Behavior: Behavior normal.      Assessment/Plan: Please see individual problem list.  Encounter for routine adult health examination with abnormal findings Assessment & Plan: Physical exam complete. We will check lab work as outlined. Pap smear and colonoscopy are up to date. She is due for a mammogram. Flu and tetanus vaccines are up to date. She declined the shingles vaccine and additional COVID vaccines. She uses alcohol socially and does not smoke or use drugs. Her activity level has decreased  due to past illness and stress. Order a mammogram. Encourage a low carb, high protein diet, regular exercise, and adequate fluid intake. Continue routine dental and eye exams. Return to care in 6 months, sooner as needed.    Insect bite of right forearm, initial encounter Assessment & Plan: She has an insect bite with swelling, itching, and warmth, but no significant progression. Continue oral antihistamines, apply topical Benadryl gel for itching, use ice for swelling, and monitor for changes or worsening.    Primary hypertension Assessment & Plan: Her diastolic blood pressure was elevated initially, improvement noted on second reading. Managed with Toprol  XL 25 mg daily. Continue  metoprolol  as prescribed and monitor blood pressure regularly. Check BMP.   Orders: -     Basic metabolic panel with GFR  Hypothyroidism, unspecified type Assessment & Plan: Her hypothyroidism is well-managed on Synthroid . Plan to recheck thyroid  function today. Continue Synthroid  as prescribed and check TSH.  Orders: -     TSH  Vitamin D  deficiency Assessment & Plan: She has previously low vitamin D  levels. Not taking a supplement. Check vitamin D  level.   Orders: -     VITAMIN D  25 Hydroxy (Vit-D Deficiency, Fractures)  Anxiety and depression Assessment & Plan: She experiences increased stress from personal events, but Zoloft  and Wellbutrin  are effective. She has regular psychiatric follow-ups. Continue Zoloft  and Wellbutrin  as prescribed and follow up with the psychiatrist as needed.   Obesity (BMI 30-39.9)  Screening mammogram for breast cancer -     3D Screening Mammogram, Left and Right; Future     Return in about 6 months (around 09/15/2024) for Follow up.   Leron Glance, NP-C [facility]

## 2024-03-21 ENCOUNTER — Ambulatory Visit: Payer: Self-pay | Admitting: Nurse Practitioner

## 2024-03-25 DIAGNOSIS — F349 Persistent mood [affective] disorder, unspecified: Secondary | ICD-10-CM | POA: Diagnosis not present

## 2024-03-31 ENCOUNTER — Other Ambulatory Visit: Payer: Self-pay | Admitting: Nurse Practitioner

## 2024-03-31 ENCOUNTER — Other Ambulatory Visit: Payer: Self-pay

## 2024-03-31 DIAGNOSIS — K219 Gastro-esophageal reflux disease without esophagitis: Secondary | ICD-10-CM

## 2024-03-31 MED ORDER — PANTOPRAZOLE SODIUM 20 MG PO TBEC
20.0000 mg | DELAYED_RELEASE_TABLET | Freq: Every day | ORAL | 2 refills | Status: AC
  Filled 2024-03-31: qty 90, 90d supply, fill #0

## 2024-03-31 MED FILL — Levothyroxine Sodium Tab 25 MCG: ORAL | 30 days supply | Qty: 45 | Fill #4 | Status: AC

## 2024-04-01 ENCOUNTER — Other Ambulatory Visit: Payer: Self-pay

## 2024-04-02 ENCOUNTER — Other Ambulatory Visit: Payer: Self-pay

## 2024-04-02 MED FILL — Ondansetron Orally Disintegrating Tab 4 MG: ORAL | 10 days supply | Qty: 30 | Fill #0 | Status: AC

## 2024-04-08 DIAGNOSIS — F349 Persistent mood [affective] disorder, unspecified: Secondary | ICD-10-CM | POA: Diagnosis not present

## 2024-04-17 ENCOUNTER — Ambulatory Visit: Admitting: Nurse Practitioner

## 2024-04-22 DIAGNOSIS — F349 Persistent mood [affective] disorder, unspecified: Secondary | ICD-10-CM | POA: Diagnosis not present

## 2024-04-27 MED FILL — Levothyroxine Sodium Tab 25 MCG: ORAL | 30 days supply | Qty: 45 | Fill #5 | Status: AC

## 2024-05-13 DIAGNOSIS — F349 Persistent mood [affective] disorder, unspecified: Secondary | ICD-10-CM | POA: Diagnosis not present

## 2024-06-24 ENCOUNTER — Encounter: Payer: Self-pay | Admitting: Pharmacist

## 2024-06-24 ENCOUNTER — Other Ambulatory Visit: Payer: Self-pay

## 2024-06-24 MED FILL — Levothyroxine Sodium Tab 25 MCG: ORAL | 30 days supply | Qty: 45 | Fill #6 | Status: AC

## 2024-09-16 ENCOUNTER — Ambulatory Visit: Admitting: Nurse Practitioner
